# Patient Record
Sex: Male | Born: 1935 | Race: White | Hispanic: No | Marital: Married | Smoking: Never smoker
Health system: Southern US, Community
[De-identification: ages and names within clinical notes are randomized; demographics above are authoritative.]

## PROBLEM LIST (undated history)

## (undated) DIAGNOSIS — I2510 Atherosclerotic heart disease of native coronary artery without angina pectoris: Secondary | ICD-10-CM

## (undated) HISTORY — PX: CORONARY ARTERY BYPASS GRAFT: SHX141

---

## 2003-08-25 ENCOUNTER — Ambulatory Visit (HOSPITAL_COMMUNITY): Admission: RE | Admit: 2003-08-25 | Discharge: 2003-08-25 | Payer: Self-pay | Admitting: Cardiology

## 2017-11-10 ENCOUNTER — Inpatient Hospital Stay (HOSPITAL_COMMUNITY)
Admission: EM | Admit: 2017-11-10 | Discharge: 2017-12-20 | DRG: 393 | Disposition: E | Payer: Medicare HMO | Attending: Internal Medicine | Admitting: Internal Medicine

## 2017-11-10 ENCOUNTER — Emergency Department (HOSPITAL_COMMUNITY): Payer: Medicare HMO

## 2017-11-10 ENCOUNTER — Observation Stay (HOSPITAL_COMMUNITY): Payer: Medicare HMO

## 2017-11-10 ENCOUNTER — Encounter (HOSPITAL_COMMUNITY): Payer: Self-pay | Admitting: Emergency Medicine

## 2017-11-10 DIAGNOSIS — R011 Cardiac murmur, unspecified: Secondary | ICD-10-CM | POA: Diagnosis not present

## 2017-11-10 DIAGNOSIS — J690 Pneumonitis due to inhalation of food and vomit: Secondary | ICD-10-CM

## 2017-11-10 DIAGNOSIS — I10 Essential (primary) hypertension: Secondary | ICD-10-CM | POA: Diagnosis not present

## 2017-11-10 DIAGNOSIS — I493 Ventricular premature depolarization: Secondary | ICD-10-CM | POA: Diagnosis present

## 2017-11-10 DIAGNOSIS — Z66 Do not resuscitate: Secondary | ICD-10-CM | POA: Diagnosis present

## 2017-11-10 DIAGNOSIS — K4030 Unilateral inguinal hernia, with obstruction, without gangrene, not specified as recurrent: Secondary | ICD-10-CM | POA: Diagnosis not present

## 2017-11-10 DIAGNOSIS — R0902 Hypoxemia: Secondary | ICD-10-CM

## 2017-11-10 DIAGNOSIS — Z978 Presence of other specified devices: Secondary | ICD-10-CM

## 2017-11-10 DIAGNOSIS — K4090 Unilateral inguinal hernia, without obstruction or gangrene, not specified as recurrent: Secondary | ICD-10-CM

## 2017-11-10 DIAGNOSIS — R1013 Epigastric pain: Secondary | ICD-10-CM | POA: Diagnosis not present

## 2017-11-10 DIAGNOSIS — J9601 Acute respiratory failure with hypoxia: Secondary | ICD-10-CM | POA: Diagnosis not present

## 2017-11-10 DIAGNOSIS — R079 Chest pain, unspecified: Secondary | ICD-10-CM | POA: Diagnosis present

## 2017-11-10 DIAGNOSIS — Z79899 Other long term (current) drug therapy: Secondary | ICD-10-CM

## 2017-11-10 DIAGNOSIS — R0789 Other chest pain: Secondary | ICD-10-CM | POA: Diagnosis present

## 2017-11-10 DIAGNOSIS — I2510 Atherosclerotic heart disease of native coronary artery without angina pectoris: Secondary | ICD-10-CM | POA: Diagnosis present

## 2017-11-10 DIAGNOSIS — D72829 Elevated white blood cell count, unspecified: Secondary | ICD-10-CM | POA: Diagnosis present

## 2017-11-10 DIAGNOSIS — K56609 Unspecified intestinal obstruction, unspecified as to partial versus complete obstruction: Secondary | ICD-10-CM | POA: Diagnosis present

## 2017-11-10 DIAGNOSIS — R0600 Dyspnea, unspecified: Secondary | ICD-10-CM

## 2017-11-10 DIAGNOSIS — Z8249 Family history of ischemic heart disease and other diseases of the circulatory system: Secondary | ICD-10-CM

## 2017-11-10 DIAGNOSIS — Z515 Encounter for palliative care: Secondary | ICD-10-CM

## 2017-11-10 DIAGNOSIS — R1110 Vomiting, unspecified: Secondary | ICD-10-CM

## 2017-11-10 DIAGNOSIS — R0603 Acute respiratory distress: Secondary | ICD-10-CM | POA: Diagnosis not present

## 2017-11-10 DIAGNOSIS — F0390 Unspecified dementia without behavioral disturbance: Secondary | ICD-10-CM | POA: Diagnosis present

## 2017-11-10 DIAGNOSIS — Z0189 Encounter for other specified special examinations: Secondary | ICD-10-CM

## 2017-11-10 DIAGNOSIS — H9190 Unspecified hearing loss, unspecified ear: Secondary | ICD-10-CM | POA: Diagnosis present

## 2017-11-10 DIAGNOSIS — R000 Tachycardia, unspecified: Secondary | ICD-10-CM | POA: Diagnosis not present

## 2017-11-10 DIAGNOSIS — E860 Dehydration: Secondary | ICD-10-CM | POA: Diagnosis not present

## 2017-11-10 DIAGNOSIS — D72828 Other elevated white blood cell count: Secondary | ICD-10-CM | POA: Diagnosis present

## 2017-11-10 DIAGNOSIS — E876 Hypokalemia: Secondary | ICD-10-CM | POA: Diagnosis not present

## 2017-11-10 DIAGNOSIS — Z951 Presence of aortocoronary bypass graft: Secondary | ICD-10-CM

## 2017-11-10 DIAGNOSIS — N179 Acute kidney failure, unspecified: Secondary | ICD-10-CM | POA: Diagnosis not present

## 2017-11-10 DIAGNOSIS — Z7189 Other specified counseling: Secondary | ICD-10-CM

## 2017-11-10 HISTORY — DX: Atherosclerotic heart disease of native coronary artery without angina pectoris: I25.10

## 2017-11-10 LAB — CBC
HCT: 35.7 % — ABNORMAL LOW (ref 39.0–52.0)
Hemoglobin: 11.5 g/dL — ABNORMAL LOW (ref 13.0–17.0)
MCH: 31.3 pg (ref 26.0–34.0)
MCHC: 32.2 g/dL (ref 30.0–36.0)
MCV: 97 fL (ref 78.0–100.0)
Platelets: 225 K/uL (ref 150–400)
RBC: 3.68 MIL/uL — ABNORMAL LOW (ref 4.22–5.81)
RDW: 13.2 % (ref 11.5–15.5)
WBC: 13.3 K/uL — ABNORMAL HIGH (ref 4.0–10.5)

## 2017-11-10 LAB — BASIC METABOLIC PANEL WITH GFR
Anion gap: 14 (ref 5–15)
BUN: 28 mg/dL — ABNORMAL HIGH (ref 6–20)
CO2: 27 mmol/L (ref 22–32)
Calcium: 9.9 mg/dL (ref 8.9–10.3)
Chloride: 98 mmol/L — ABNORMAL LOW (ref 101–111)
Creatinine, Ser: 1.17 mg/dL (ref 0.61–1.24)
GFR calc Af Amer: >60 mL/min
GFR calc non Af Amer: 53 mL/min — ABNORMAL LOW
Glucose, Bld: 191 mg/dL — ABNORMAL HIGH (ref 65–99)
Potassium: 3.9 mmol/L (ref 3.5–5.1)
Sodium: 139 mmol/L (ref 135–145)

## 2017-11-10 LAB — I-STAT TROPONIN, ED: TROPONIN I, POC: 0.02 ng/mL (ref 0.00–0.08)

## 2017-11-10 MED ORDER — NITROGLYCERIN 2 % TD OINT
1.0000 [in_us] | TOPICAL_OINTMENT | Freq: Once | TRANSDERMAL | Status: AC
  Administered 2017-11-10: 1 [in_us] via TOPICAL
  Filled 2017-11-10: qty 1

## 2017-11-10 MED ORDER — ASPIRIN EC 325 MG PO TBEC
325.0000 mg | DELAYED_RELEASE_TABLET | Freq: Every day | ORAL | Status: DC
  Administered 2017-11-11 – 2017-11-13 (×3): 325 mg via ORAL
  Filled 2017-11-10 (×4): qty 1

## 2017-11-10 MED ORDER — NITROGLYCERIN 0.4 MG SL SUBL: 0.4000 mg | SUBLINGUAL_TABLET | SUBLINGUAL | Status: DC | PRN

## 2017-11-10 MED ORDER — HYDRALAZINE HCL 20 MG/ML IJ SOLN: 5.0000 mg | INTRAMUSCULAR | Status: DC | PRN

## 2017-11-10 MED ORDER — ACETAMINOPHEN 325 MG PO TABS: 650.0000 mg | ORAL_TABLET | Freq: Four times a day (QID) | ORAL | Status: DC | PRN

## 2017-11-10 MED ORDER — LOSARTAN POTASSIUM 50 MG PO TABS
50.0000 mg | ORAL_TABLET | Freq: Every day | ORAL | Status: DC
  Administered 2017-11-11: 50 mg via ORAL
  Filled 2017-11-10: qty 1

## 2017-11-10 MED ORDER — DM-GUAIFENESIN ER 30-600 MG PO TB12: 1.0000 | ORAL_TABLET | Freq: Two times a day (BID) | ORAL | Status: DC | PRN

## 2017-11-10 MED ORDER — CALCIUM CARBONATE ANTACID 500 MG PO CHEW: 1.0000 | CHEWABLE_TABLET | ORAL | Status: DC | PRN

## 2017-11-10 MED ORDER — ADULT MULTIVITAMIN W/MINERALS CH
1.0000 | ORAL_TABLET | Freq: Every day | ORAL | Status: DC
  Administered 2017-11-11 – 2017-11-13 (×3): 1 via ORAL
  Filled 2017-11-10 (×4): qty 1

## 2017-11-10 MED ORDER — ONDANSETRON HCL 4 MG/2ML IJ SOLN
4.0000 mg | Freq: Four times a day (QID) | INTRAMUSCULAR | Status: DC | PRN
  Administered 2017-11-13 – 2017-11-14 (×3): 4 mg via INTRAVENOUS
  Filled 2017-11-10: qty 2

## 2017-11-10 MED ORDER — ENOXAPARIN SODIUM 40 MG/0.4ML ~~LOC~~ SOLN: 40.0000 mg | SUBCUTANEOUS | Status: DC

## 2017-11-10 MED ORDER — PANTOPRAZOLE SODIUM 40 MG PO TBEC
40.0000 mg | DELAYED_RELEASE_TABLET | Freq: Every day | ORAL | Status: DC
  Administered 2017-11-11 – 2017-11-13 (×4): 40 mg via ORAL
  Filled 2017-11-10 (×4): qty 1

## 2017-11-10 MED ORDER — IOPAMIDOL (ISOVUE-300) INJECTION 61%
INTRAVENOUS | Status: AC
  Administered 2017-11-10: 100 mL
  Filled 2017-11-10: qty 100

## 2017-11-10 MED ORDER — MORPHINE SULFATE (PF) 4 MG/ML IV SOLN
1.0000 mg | INTRAVENOUS | Status: DC | PRN
  Administered 2017-11-15: 1 mg via INTRAVENOUS
  Filled 2017-11-10: qty 1

## 2017-11-10 MED ORDER — ZOLPIDEM TARTRATE 5 MG PO TABS: 5.0000 mg | ORAL_TABLET | Freq: Every evening | ORAL | Status: DC | PRN

## 2017-11-10 NOTE — ED Notes (Signed)
Delay in lab draw,  Pt not in room at this time. 

## 2017-11-10 NOTE — H&P (Signed)
History and Physical    Benjamin MassedJames Petersen ZOX:[id number] DOB: Aug 02, 1924 DOA: 10/23/2017  Referring MD/NP/PA:   PCP: Burton Apleyoberts, Ronald, MD   Patient coming from:  The patient is coming from home.  At baseline, pt is partially dependent for most of ADL.   Chief Complaint: chest pain and epigastric abdominal pain  HPI: Benjamin MassedJames Petersen is a 84 y.o. male with medical history significant of hypertension, CAD, CABG in 1992, who presents with chest pain and epigastric abdominal pain.  Pt states that has had several episodes of chest pain since this morning. His pain is located in the frontal lower chest and also in the epigastric area. The pain was intermittent, moderate, sharp, nonradiating. Patient reported to ED physician that he had a mild SOB, but denies any shortness breath to me. No calf pain. Pt was given one dose ASA and NGT patch. He is CP free now.  He has mild cough with clear mucus production. He also has sore throat, but no runny nose. Denies fever or chills. Patient does not have nausea, vomiting, diarrhea, abdominal pain, symptoms of UTI. He has rashes in front chest wall, which has been going on for about a week and are itchy. No new medication recently.   ED Course: pt was found to have WBC 13.1, negative flu PCR, negative rapid strep test, lipase 26, negative troponin, creatinine 1.17, GFR>60, blood pressure 182/91, no tachycardia, temperature normal, oxygen saturation section 97% on room air. Chest x-ray showed streaks of bright basilar opacity. Pending CT-abd/pelivs. Pt is placed on telemetry bed for observation.  Review of Systems:   General: no fevers, chills, no body weight gain, has poor appetite, has fatigue HEENT: no blurry vision, hearing changes or sore throat Respiratory: no dyspnea, has coughing, no wheezing CV: has chest pain, no palpitations GI: no nausea, vomiting, has abdominal pain, no diarrhea, constipation GU: no dysuria, burning on urination, increased urinary  frequency, hematuria  Ext: no leg edema Neuro: no unilateral weakness, numbness, or tingling, no vision change or hearing loss Skin: has rash, no skin tear. MSK: No muscle spasm, no deformity, no limitation of range of movement in spin Heme: No easy bruising.  Travel history: No recent long distant travel.  Allergy: No Known Allergies  Past Medical History:  Diagnosis Date  . Coronary artery disease     Past Surgical History:  Procedure Laterality Date  . CORONARY ARTERY BYPASS GRAFT      Social History:  reports that he has never smoked. He has never used smokeless tobacco. He reports that he drank alcohol. He reports that he has current or past drug history.  Family History:  Family History  Problem Relation Age of Onset  . Heart disease Mother      Prior to Admission medications   Medication Sig Start Date End Date Taking? Authorizing Provider  acetaminophen (TYLENOL) 325 MG tablet Take 325-650 mg by mouth every 6 (six) hours as needed (for pain or headaches).   Yes [provider]  calcium carbonate (TUMS - DOSED IN MG ELEMENTAL CALCIUM) 500 MG chewable tablet Chew 1-2 tablets by mouth as needed for indigestion or heartburn.   Yes [provider]  chlorthalidone (HYGROTON) 25 MG tablet Take 12.5 mg by mouth daily. 09/10/17  Yes [provider]  losartan (COZAAR) 50 MG tablet Take 50 mg by mouth at bedtime.  09/10/17  Yes [provider]  Multiple Vitamins-Minerals (ONE-A-DAY PROACTIVE 65+) TABS Take 1 tablet by mouth daily.   Yes [provider]    Physical Exam: Vitals:   11/11/17 0030 11/11/17 0045 11/11/17 0100 11/11/17 0202  BP: (!) 161/69  (!) 154/89 (!) 158/71  Pulse: 84 87  64  Resp: 18 15 17 18   Temp:    (!) 89.4 F (31.9 C)  TempSrc:    Oral  SpO2: 97% 97% 100% 99%  Weight:    61.4 kg (135 lb 6.4 oz)  Height:    6\' 1"  (1.854 m)   General: Not in acute distress HEENT:       Eyes: PERRL, EOMI, no scleral  icterus.       ENT: No discharge from the ears and nose, no pharynx injection, no tonsillar enlargement.        Neck: No JVD, no bruit, no mass felt. Heme: No neck lymph node enlargement. Cardiac: S1/S2, RRR, No murmurs, No gallops or rubs. Respiratory: no rales, wheezing, rhonchi or rubs. GI: Soft, nondistended, has tenderness in Epigastric area, no rebound pain, no organomegaly, BS present. GU: No hematuria Ext: No pitting leg edema bilaterally. 2+DP/PT pulse bilaterally. Musculoskeletal: No joint deformities, No joint redness or warmth, no limitation of ROM in spin. Skin: has rashes in chest Neuro: Alert, oriented X3, cranial nerves II-XII grossly intact, moves all extremities normally. Psych: Patient is not psychotic, no suicidal or hemocidal ideation.  Labs on Admission: I have personally reviewed following labs and imaging studies  CBC: Recent Labs  Lab 12/03/2017 2150  WBC 13.3*  HGB 11.5*  HCT 35.7*  MCV 97.0  PLT 225   Basic Metabolic Panel: Recent Labs  Lab December 03, 2017 2150  NA 139  K 3.9  CL 98*  CO2 27  GLUCOSE 191*  BUN 28*  CREATININE 1.17  CALCIUM 9.9   GFR: Estimated Creatinine Clearance: 35.7 mL/min (by C-G formula based on SCr of 1.17 mg/dL). Liver Function Tests: Recent Labs  Lab 11/11/17 0010  AST 29  ALT 20  ALKPHOS 68  BILITOT 0.7  PROT 6.6  ALBUMIN 3.8   Recent Labs  Lab 11/11/17 0010  LIPASE 26   No results for input(s): AMMONIA in the last 168 hours. Coagulation Profile: Recent Labs  Lab 11/11/17 0121  INR 1.04   Cardiac Enzymes: Recent Labs  Lab 11/11/17 0010  TROPONINI <0.03   BNP (last 3 results) No results for input(s): PROBNP in the last 8760 hours. HbA1C: No results for input(s): HGBA1C in the last 72 hours. CBG: No results for input(s): GLUCAP in the last 168 hours. Lipid Profile: No results for input(s): CHOL, HDL, LDLCALC, TRIG, CHOLHDL, LDLDIRECT in the last 72 hours. Thyroid Function Tests: No results for  input(s): TSH, T4TOTAL, FREET4, T3FREE, THYROIDAB in the last 72 hours. Anemia Panel: No results for input(s): VITAMINB12, FOLATE, FERRITIN, TIBC, IRON, RETICCTPCT in the last 72 hours. Urine analysis:    Component Value Date/Time   COLORURINE YELLOW 11/11/2017 0050   APPEARANCEUR CLOUDY (A) 11/11/2017 0050   LABSPEC 1.031 (H) 11/11/2017 0050   PHURINE 7.0 11/11/2017 0050   GLUCOSEU NEGATIVE 11/11/2017 0050   HGBUR NEGATIVE 11/11/2017 0050   BILIRUBINUR NEGATIVE 11/11/2017 0050   KETONESUR NEGATIVE 11/11/2017 0050   PROTEINUR 30 (A) 11/11/2017 0050   NITRITE NEGATIVE 11/11/2017 0050   LEUKOCYTESUR NEGATIVE 11/11/2017 0050   Sepsis Labs: @LABRCNTIP (procalcitonin:4,lacticidven:4) ) Recent Results (from the past 240 hour(s))  Rapid Strep Screen (Not at [facility])     Status: None   Collection Time: 11/11/17 12:50 AM  Result Value Ref Range Status   Streptococcus,  Group A Screen (Direct) NEGATIVE NEGATIVE Final    Comment: (NOTE) A Rapid Antigen test may result negative if the antigen level in the sample is below the detection level of this test. The FDA has not cleared this test as a stand-alone test therefore the rapid antigen negative result has reflexed to a Group A Strep culture. Performed at [facility], [address], [place], [place] [address]      Radiological Exams on Admission: Dg Chest 2 View  Result Date: 19-Nov-2017 CLINICAL DATA:  Chest and epigastric pain. EXAM: CHEST - 2 VIEW COMPARISON:  None. FINDINGS: Post median sternotomy. Heart size normal. There is aortic atherosclerosis. Mild vascular congestion. Streaky bibasilar opacities. There is an azygos fissure. No definite pleural fluid or pneumothorax. IMPRESSION: 1. Streaky bibasilar opacities, likely atelectasis. 2. Post median sternotomy with aortic atherosclerosis. Mild vascular congestion. Electronically Signed   By: Rubye Oaks M.D.   On: 2017-11-19 22:57   Ct Abdomen Pelvis W Contrast  Result  Date: 11/11/2017 CLINICAL DATA:  Initial evaluation for acute epigastric abdominal pain for 1 day. EXAM: CT ABDOMEN AND PELVIS WITH CONTRAST TECHNIQUE: Multidetector CT imaging of the abdomen and pelvis was performed using the standard protocol following bolus administration of intravenous contrast. CONTRAST:  ISOVUE-300 IOPAMIDOL (ISOVUE-300) INJECTION 61% COMPARISON:  None available. FINDINGS: Lower chest: Scattered bibasilar atelectatic and/or fibrotic changes present within the lung bases. Superimposed bronchiectasis present at the medial left lung base. No pleural or pericardial effusion. Prominent coronary artery calcifications noted. Hepatobiliary: Multiple scattered cysts noted within the liver, largest discrete of which position within the medial left hepatic lobe in measures approximately 2 cm. Gallbladder irregular invaginating upon the adjacent hepatic parenchyma without associated inflammatory changes. Mild intra and extrahepatic biliary dilatation, suspected to be related to advanced age. Pancreas: Pancreas demonstrates no acute abnormality. Scattered punctate calcifications at the uncinate process of the pancreas may reflect sequelae of chronic pancreatitis. No acute peripancreatic inflammation. No significant pancreatic ductal dilatation. Spleen: Spleen within normal limits. Adrenals/Urinary Tract: Diffuse thickening of the adrenal glands noted bilaterally without discrete mass. Kidneys equal in size with symmetric enhancement. 19 mm left renal cyst noted. No nephrolithiasis, hydronephrosis, or focal enhancing renal mass. No appreciable hydroureter. Partially distended bladder within normal limits. Stomach/Bowel: Intraluminal fluid density noted within the distal esophagus. Small hiatal hernia. Stomach moderately distended with fluid in the gastric lumen. There are multiple dilated loops of bowel scattered throughout the abdomen with internal air-fluid levels, consistent with small bowel  obstruction. These measure up to approximately 3 cm in diameter. There is a left inguinal hernia containing a short segment of fluid-filled small bowel (series 3, image 71). Small bowel is dilated proximally, and decompressed distally as it courses out of the hernia sac, consistent with small bowel obstruction. Distal small bowel is decompressed to the level of the terminal ileum. Moderate stool within the colon which is otherwise unremarkable without acute inflammatory changes. No findings to suggest acute appendicitis. Vascular/Lymphatic: Severe aorto bi-iliac atherosclerotic disease. No aneurysm. Mesenteric vessels are patent proximally. No adenopathy. Reproductive: Prostate somewhat enlarged measuring 5.1 cm in transverse diameter. Other: No free intraperitoneal air. Small volume free fluid within the abdomen and adjacent to the liver, likely reactive. Sequelae of probable prior inguinal hernia repair noted bilaterally, suggesting at the current left inguinal hernia is recurrent. Musculoskeletal: No acute osseus abnormality. Visualized osseous structures demonstrate a mottled appearance without discrete lytic or blastic osseous lesion. 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 IMPRESSION:  1. Left inguinal hernia containing the short-segment of small bowel with secondary small bowel obstruction. 2. Associated small volume free fluid within the abdomen, likely reactive. 3. Advanced 3 vessel coronary artery calcifications with severe atherosclerosis. 4. Additional incidental findings as above. Electronically Signed   By: Rise Mu M.D.   On: 11/11/2017 00:52     EKG: Independently reviewed.  Sinus rhythm, frequent PVC, QTC 456, nonspecific T-wave change.  Assessment/Plan Principal Problem:   SBO (small bowel obstruction) (HCC) Active Problems:   Chest pain   Essential hypertension   Leukocytosis   Epigastric abdominal pain   Left inguinal hernia   SBO 2/2 to  incarcerated left inguinal hernia: pt has epigastric abdominal pain, no nausea, vomiting, but surprisingly CT scan showed shows left inguinal hernia which caused bowel obstruction. Patient has mild leukocytosis, but no fever or tachycardia, dose not meet criteria for sepsis. Currently hemodynamically stable. Gen. Surgeon, Dr. Luisa Hart was consulted. Per Dr. Luisa Hart, pt needs surgery, but currently patient refuses surgical intervention. Dr. Luisa Hart recommended NG tube if he has nausea and vomiting.  Dr. Larwance Rote will reassess pt this morning to see if he changes his mind for surgery.   -Admit to tele bed for obs (bed was requested before knowing SOB) -NPO   -prn NG tube (not started yet) -morphine prn pain -Prn Zofran prn nausea   -IVF: NS 75 cc/h -INR/PTT/type & screen -Follow-up general surgeons recommendation  Chest pain: not sure if this is true chest pain or just radiating pain from abdomen. Trop negative. Giving his history of CAD and s/p of CABG. Will work up to r/o ACS. He may have demand ischemia secondary to elevated blood pressure - cycle CE q6 x3 and repeat EKG in the am  - prn Nitroglycerin, Morphine, and aspirin - Risk factor stratification: will check FLP and A1C  - 2d echo  Essential hypertension: bp 182/91 -continue losartan - hold chlorthalidone while pt is on NPO and need IVF -IV hydralazine when necessary  Leukocytosis: Flu pcr negative. Rapid strep negative. No fever. likely due to SBO. Will hold off Abx now. -f/u Bx and UA -will get Procalcitonin and trend lactic acid levels   DVT ppx: SCD Code Status: DNR (I discussed with patient in the presence of his son and wife who is the POA, and explained the meaning of CODE STATUS. Patient wants to be DNR) Family Communication:  Yes, patient's wife and son at bed side Disposition Plan:  Anticipate discharge back to previous home environment Consults called:  Dr. Luisa Hart of Gen. surgeon Admission status: Obs / tele      Date of Service 11/11/2017    Lorretta Harp [facility] Pager [phone]  If 7PM-7AM, please contact night-coverage [email or web address] Password [facility] 11/11/2017, 4:40 AM

## 2017-11-10 NOTE — ED Triage Notes (Addendum)
Patient arrived with EMS form home , reports intermittent central chest/epigastric pain this evening with mild SOB , no nausea or diaphoresis , he took 1 NTG sl and 1 ASA 325 mg. prior to arrival with relief , denies chest pain at arrival .

## 2017-11-10 NOTE — ED Provider Notes (Signed)
I saw and evaluated the patient, reviewed the resident's note and I agree with the findings and plan.   EKG Interpretation  Date/Time:  Friday November 10 2017 21:42:43 EDT Ventricular Rate:  87 PR Interval:  198 QRS Duration: 92 QT Interval:  388 QTC Calculation: 466 R Axis:   8 Text Interpretation:  Sinus rhythm with frequent Premature ventricular complexes Right atrial enlargement Borderline ECG Confirmed by Lorre NickAllen, Kashira Behunin ([id number]) on 11/16/2017 10:34:4040 PM     84 year old male with prior history of coronary artery disease presents with intermittent chest pain with associated dyspnea.  Was given nitroglycerin and aspirin prior to arrival with relief.  He is currently pain-free.  EKG shows no acute ischemic changes.  Will admit for further management   Lorre NickAllen, Desia Saban, MD 10/27/2017 2307

## 2017-11-10 NOTE — ED Notes (Signed)
Patient transported to CT 

## 2017-11-10 NOTE — ED Provider Notes (Signed)
[facility] Provider Note   CSN: [id number] Arrival date & time: 01/05/2018  2126     History   Chief Complaint Chief Complaint  Patient presents with  . Chest Pain    HPI Charlestine MassedJames Shawgo is a 84 y.o. male.  The history is provided by the patient, the spouse and a relative.  Chest Pain   This is a new problem. The current episode started more than 1 week ago. The problem occurs daily. The problem has been gradually worsening. The pain is present in the substernal region. The patient is experiencing no pain (was moderate). The quality of the pain is described as brief and sharp. The pain does not radiate. Associated symptoms include nausea. Pertinent negatives include no abdominal pain, no back pain, no cough, no exertional chest pressure, no fever, no headaches, no irregular heartbeat, no leg pain, no lower extremity edema, no palpitations, no shortness of breath and no vomiting. He has tried nitroglycerin for the symptoms. The treatment provided significant relief.  His past medical history is significant for CAD and hypertension.    Past Medical History:  Diagnosis Date  . Coronary artery disease     Patient Active Problem List   Diagnosis Date Noted  . Chest pain 04/13/18  . Essential hypertension 04/13/18  . Leukocytosis 04/13/18  . Epigastric abdominal pain 04/13/18       Home Medications    Prior to Admission medications   Medication Sig Start Date End Date Taking? Authorizing Provider  acetaminophen (TYLENOL) 325 MG tablet Take 325-650 mg by mouth every 6 (six) hours as needed (for pain or headaches).   Yes [provider]  calcium carbonate (TUMS - DOSED IN MG ELEMENTAL CALCIUM) 500 MG chewable tablet Chew 1-2 tablets by mouth as needed for indigestion or heartburn.   Yes [provider]  chlorthalidone (HYGROTON) 25 MG tablet Take 12.5 mg by mouth daily. 09/10/17  Yes [provider]    losartan (COZAAR) 50 MG tablet Take 50 mg by mouth at bedtime.  09/10/17  Yes [provider]  Multiple Vitamins-Minerals (ONE-A-DAY PROACTIVE 65+) TABS Take 1 tablet by mouth daily.   Yes [provider]    Family History No family history on file.  Social History Social History   Tobacco Use  . Smoking status: Never Smoker  . Smokeless tobacco: Never Used  Substance Use Topics  . Alcohol use: Not on file  . Drug use: Not on file     Allergies   Patient has no known allergies.   Review of Systems Review of Systems  Constitutional: Positive for fatigue. Negative for chills and fever.  HENT: Negative for ear pain and sore throat.   Eyes: Negative for visual disturbance.  Respiratory: Negative for cough and shortness of breath.   Cardiovascular: Positive for chest pain. Negative for palpitations and leg swelling.  Gastrointestinal: Positive for nausea. Negative for abdominal pain and vomiting.  Genitourinary: Negative for dysuria.  Musculoskeletal: Negative for back pain.  Skin: Negative for rash.  Neurological: Negative for light-headedness and headaches.  All other systems reviewed and are negative.    Physical Exam Updated Vital Signs BP (!) 173/83   Pulse 90   Temp 98.1 F (36.7 C) (Oral)   Resp 15   Ht 6\' 1"  (1.854 m)   Wt 72.6 kg (160 lb)   SpO2 97%   BMI 21.11 kg/m   Physical Exam  Constitutional: He appears well-developed and well-nourished. No distress.  HENT:  Head: Normocephalic and atraumatic.  Eyes: Conjunctivae are normal.  Neck: Neck supple.  Cardiovascular: Normal rate, regular rhythm and intact distal pulses.  No murmur heard. Pulses:      Radial pulses are 2+ on the right side, and 2+ on the left side.  Pulmonary/Chest: Effort normal and breath sounds normal. No respiratory distress. He has no decreased breath sounds. He has no wheezes. He has no rales.  Abdominal: Soft. There is no tenderness.  Musculoskeletal: He  exhibits no edema.       Right lower leg: He exhibits no edema.       Left lower leg: He exhibits no edema.  Neurological: He is alert.  Skin: Skin is warm and dry.  Psychiatric: He has a normal mood and affect.  Nursing note and vitals reviewed.    ED Treatments / Results  Labs (all labs ordered are listed, but only abnormal results are displayed) Labs Reviewed  BASIC METABOLIC PANEL - Abnormal; Notable for the following components:      Result Value   Chloride 98 (*)    Glucose, Bld 191 (*)    BUN 28 (*)    GFR calc non Af Amer 53 (*)    All other components within normal limits  CBC - Abnormal; Notable for the following components:   WBC 13.3 (*)    RBC 3.68 (*)    Hemoglobin 11.5 (*)    HCT 35.7 (*)    All other components within normal limits  RAPID STREP SCREEN (NOT AT [facility])  CULTURE, BLOOD (ROUTINE X 2)  CULTURE, BLOOD (ROUTINE X 2)  HEPATIC FUNCTION PANEL  INFLUENZA PANEL BY PCR (TYPE A & B)  URINALYSIS, ROUTINE W REFLEX MICROSCOPIC  LIPASE, BLOOD  HEMOGLOBIN A1C  LIPID PANEL  TROPONIN I  TROPONIN I  TROPONIN I  CBG MONITORING, ED  I-STAT TROPONIN, ED    EKG EKG Interpretation  Date/Time:  Friday November 10 2017 21:42:43 EDT Ventricular Rate:  87 PR Interval:  198 QRS Duration: 92 QT Interval:  388 QTC Calculation: 466 R Axis:   8 Text Interpretation:  Sinus rhythm with frequent Premature ventricular complexes Right atrial enlargement Borderline ECG Confirmed by Lorre Nick ([id number]) on 10/31/2017 10:34:40 PM   Radiology Dg Chest 2 View  Result Date: 10/27/2017 CLINICAL DATA:  Chest and epigastric pain. EXAM: CHEST - 2 VIEW COMPARISON:  None. FINDINGS: Post median sternotomy. Heart size normal. There is aortic atherosclerosis. Mild vascular congestion. Streaky bibasilar opacities. There is an azygos fissure. No definite pleural fluid or pneumothorax. IMPRESSION: 1. Streaky bibasilar opacities, likely atelectasis. 2. Post median sternotomy with aortic  atherosclerosis. Mild vascular congestion. Electronically Signed   By: Rubye Oaks M.D.   On: 10/30/2017 22:57    Procedures Procedures (including critical care time)  Medications Ordered in ED Medications  acetaminophen (TYLENOL) tablet 650 mg (has no administration in time range)  calcium carbonate (TUMS - dosed in mg elemental calcium) chewable tablet 200-400 mg of elemental calcium (has no administration in time range)  losartan (COZAAR) tablet 50 mg (has no administration in time range)  multivitamin with minerals tablet 1 tablet (has no administration in time range)  pantoprazole (PROTONIX) EC tablet 40 mg (has no administration in time range)  dextromethorphan-guaiFENesin (MUCINEX DM) 30-600 MG per 12 hr tablet 1 tablet (has no administration in time range)  nitroGLYCERIN (NITROSTAT) SL tablet 0.4 mg (has no administration in time range)  morphine 4 MG/ML injection 1 mg (has no administration  in time range)  aspirin EC tablet 325 mg (has no administration in time range)  ondansetron (ZOFRAN) injection 4 mg (has no administration in time range)  enoxaparin (LOVENOX) injection 40 mg (has no administration in time range)  zolpidem (AMBIEN) tablet 5 mg (has no administration in time range)  hydrALAZINE (APRESOLINE) injection 5 mg (has no administration in time range)  nitroGLYCERIN (NITROGLYN) 2 % ointment 1 inch (1 inch Topical Given 2017-11-26 2222)  iopamidol (ISOVUE-300) 61 % injection (100 mLs  Contrast Given 11-26-17 2349)     Initial Impression / Assessment and Plan / ED Course  I have reviewed the triage vital signs and the nursing notes.  Pertinent labs & imaging results that were available during my care of the patient were reviewed by me and considered in my medical decision making (see chart for details).     Patient is a 84 year old male with history of CAD hypertension with a history of CABG 1992.  He presents with 1 week of worsening chest pain.  He states this  started in the epigastrium but over the last few days has been mostly in the substernal chest area.  Episodes are brief and atypical.  No specific aggravating factors.  He received aspirin at home and nitroglycerin with EMS which did resolve his chest pain.  On exam here patient is moderately hypertensive with a blood pressure in the 160s.  He is afebrile and breathing comfortably with no hypoxia.  Lung sounds are clear, heart sounds normal.  No peripheral edema.  Patient is currently chest pain-free.  Equal peripheral pulses.    Nitro patch applied here.  EKG shows sinus rhythm with PVCs but no acute ischemia.  First troponin was 0.02.  Patient's pain may be GI related versus ACS.  Given patient's high risk history, age, patient will be admitted for ACS evaluation and further workup of his chest pain.  Final Clinical Impressions(s) / ED Diagnoses   Final diagnoses:  Chest pain, unspecified type    ED Discharge Orders    None       Dwana Melena, DO 11/11/17 2130

## 2017-11-10 NOTE — ED Notes (Signed)
Patient transported to X-ray 

## 2017-11-11 ENCOUNTER — Observation Stay (HOSPITAL_BASED_OUTPATIENT_CLINIC_OR_DEPARTMENT_OTHER): Payer: Medicare HMO

## 2017-11-11 ENCOUNTER — Encounter (HOSPITAL_COMMUNITY): Payer: Self-pay | Admitting: Internal Medicine

## 2017-11-11 ENCOUNTER — Other Ambulatory Visit: Payer: Self-pay

## 2017-11-11 DIAGNOSIS — I10 Essential (primary) hypertension: Secondary | ICD-10-CM

## 2017-11-11 DIAGNOSIS — K56609 Unspecified intestinal obstruction, unspecified as to partial versus complete obstruction: Secondary | ICD-10-CM | POA: Diagnosis not present

## 2017-11-11 DIAGNOSIS — D72829 Elevated white blood cell count, unspecified: Secondary | ICD-10-CM

## 2017-11-11 DIAGNOSIS — K4090 Unilateral inguinal hernia, without obstruction or gangrene, not specified as recurrent: Secondary | ICD-10-CM | POA: Diagnosis not present

## 2017-11-11 DIAGNOSIS — R079 Chest pain, unspecified: Secondary | ICD-10-CM

## 2017-11-11 DIAGNOSIS — I351 Nonrheumatic aortic (valve) insufficiency: Secondary | ICD-10-CM | POA: Diagnosis not present

## 2017-11-11 DIAGNOSIS — R1013 Epigastric pain: Secondary | ICD-10-CM | POA: Diagnosis not present

## 2017-11-11 DIAGNOSIS — J690 Pneumonitis due to inhalation of food and vomit: Secondary | ICD-10-CM | POA: Diagnosis not present

## 2017-11-11 DIAGNOSIS — N179 Acute kidney failure, unspecified: Secondary | ICD-10-CM | POA: Diagnosis not present

## 2017-11-11 DIAGNOSIS — R1110 Vomiting, unspecified: Secondary | ICD-10-CM | POA: Diagnosis not present

## 2017-11-11 DIAGNOSIS — Z515 Encounter for palliative care: Secondary | ICD-10-CM | POA: Diagnosis not present

## 2017-11-11 DIAGNOSIS — R0603 Acute respiratory distress: Secondary | ICD-10-CM | POA: Diagnosis not present

## 2017-11-11 LAB — COMPREHENSIVE METABOLIC PANEL
ALBUMIN: 3.8 g/dL (ref 3.5–5.0)
ALK PHOS: 74 U/L (ref 38–126)
ALT: 21 U/L (ref 17–63)
AST: 32 U/L (ref 15–41)
Anion gap: 12 (ref 5–15)
BILIRUBIN TOTAL: 0.6 mg/dL (ref 0.3–1.2)
BUN: 24 mg/dL — AB (ref 6–20)
CO2: 23 mmol/L (ref 22–32)
CREATININE: 1.01 mg/dL (ref 0.61–1.24)
Calcium: 9.2 mg/dL (ref 8.9–10.3)
Chloride: 103 mmol/L (ref 101–111)
GFR calc Af Amer: >60 mL/min (ref >60)
GFR calc non Af Amer: >60 mL/min (ref >60)
GLUCOSE: 132 mg/dL — AB (ref 65–99)
Potassium: 4.1 mmol/L (ref 3.5–5.1)
Sodium: 138 mmol/L (ref 135–145)
TOTAL PROTEIN: 6.5 g/dL (ref 6.5–8.1)

## 2017-11-11 LAB — HEPATIC FUNCTION PANEL
ALK PHOS: 68 U/L (ref 38–126)
ALT: 20 U/L (ref 17–63)
AST: 29 U/L (ref 15–41)
Albumin: 3.8 g/dL (ref 3.5–5.0)
BILIRUBIN TOTAL: 0.7 mg/dL (ref 0.3–1.2)
Total Protein: 6.6 g/dL (ref 6.5–8.1)

## 2017-11-11 LAB — URINALYSIS, ROUTINE W REFLEX MICROSCOPIC
BACTERIA UA: NONE SEEN
BILIRUBIN URINE: NEGATIVE
Glucose, UA: NEGATIVE mg/dL
HGB URINE DIPSTICK: NEGATIVE
KETONES UR: NEGATIVE mg/dL
Leukocytes, UA: NEGATIVE
Nitrite: NEGATIVE
Protein, ur: 30 mg/dL — AB
SQUAMOUS EPITHELIAL / LPF: NONE SEEN
Specific Gravity, Urine: 1.031 — ABNORMAL HIGH (ref 1.005–1.030)
pH: 7 (ref 5.0–8.0)

## 2017-11-11 LAB — TYPE AND SCREEN
ABO/RH(D): A POS
ANTIBODY SCREEN: NEGATIVE

## 2017-11-11 LAB — LIPID PANEL
CHOL/HDL RATIO: 2.4 ratio
Cholesterol: 177 mg/dL (ref 0–200)
HDL: 73 mg/dL (ref >40)
LDL CALC: 93 mg/dL (ref 0–99)
Triglycerides: 55 mg/dL (ref <150)
VLDL: 11 mg/dL (ref 0–40)

## 2017-11-11 LAB — LIPASE, BLOOD: LIPASE: 26 U/L (ref 11–51)

## 2017-11-11 LAB — TROPONIN I: Troponin I: 0.04 ng/mL (ref <0.03)

## 2017-11-11 LAB — CBC WITH DIFFERENTIAL/PLATELET
BASOS ABS: 0 10*3/uL (ref 0.0–0.1)
BASOS PCT: 0 %
Eosinophils Absolute: 0 10*3/uL (ref 0.0–0.7)
Eosinophils Relative: 0 %
HCT: 35 % — ABNORMAL LOW (ref 39.0–52.0)
HEMOGLOBIN: 11.3 g/dL — AB (ref 13.0–17.0)
LYMPHS ABS: 0.9 10*3/uL (ref 0.7–4.0)
LYMPHS PCT: 6 %
MCH: 31 pg (ref 26.0–34.0)
MCHC: 32.3 g/dL (ref 30.0–36.0)
MCV: 95.9 fL (ref 78.0–100.0)
Monocytes Absolute: 0.4 10*3/uL (ref 0.1–1.0)
Monocytes Relative: 3 %
NEUTROS ABS: 13 10*3/uL — AB (ref 1.7–7.7)
Neutrophils Relative %: 91 %
Platelets: 208 10*3/uL (ref 150–400)
RBC: 3.65 MIL/uL — ABNORMAL LOW (ref 4.22–5.81)
RDW: 13.1 % (ref 11.5–15.5)
WBC: 14.3 10*3/uL — ABNORMAL HIGH (ref 4.0–10.5)

## 2017-11-11 LAB — PROTIME-INR
INR: 1.04
Prothrombin Time: 13.5 seconds (ref 11.4–15.2)

## 2017-11-11 LAB — ECHOCARDIOGRAM COMPLETE
Height: 73 in
Weight: 2166.4 oz

## 2017-11-11 LAB — RAPID STREP SCREEN (MED CTR MEBANE ONLY): Streptococcus, Group A Screen (Direct): NEGATIVE

## 2017-11-11 LAB — INFLUENZA PANEL BY PCR (TYPE A & B)
Influenza A By PCR: NEGATIVE
Influenza B By PCR: NEGATIVE

## 2017-11-11 LAB — MAGNESIUM: MAGNESIUM: 1.9 mg/dL (ref 1.7–2.4)

## 2017-11-11 LAB — HEMOGLOBIN A1C
HEMOGLOBIN A1C: 6.3 % — AB (ref 4.8–5.6)
MEAN PLASMA GLUCOSE: 134.11 mg/dL

## 2017-11-11 LAB — LACTIC ACID, PLASMA
Lactic Acid, Venous: 2.1 mmol/L (ref 0.5–1.9)
Lactic Acid, Venous: 2.3 mmol/L (ref 0.5–1.9)

## 2017-11-11 LAB — APTT: APTT: 32 s (ref 24–36)

## 2017-11-11 LAB — PROCALCITONIN: Procalcitonin: <0.1 ng/mL

## 2017-11-11 LAB — ABO/RH: ABO/RH(D): A POS

## 2017-11-11 MED ORDER — SODIUM CHLORIDE 0.9 % IV BOLUS (SEPSIS)
1000.0000 mL | Freq: Once | INTRAVENOUS | Status: AC
  Administered 2017-11-11: 1000 mL via INTRAVENOUS

## 2017-11-11 MED ORDER — SODIUM CHLORIDE 0.9 % IV SOLN
INTRAVENOUS | Status: DC
  Administered 2017-11-11 – 2017-11-13 (×5): via INTRAVENOUS

## 2017-11-11 MED ORDER — ENALAPRILAT 1.25 MG/ML IV SOLN
0.6250 mg | Freq: Four times a day (QID) | INTRAVENOUS | Status: DC
  Administered 2017-11-11 – 2017-11-12 (×6): 0.625 mg via INTRAVENOUS
  Filled 2017-11-11 (×8): qty 0.5

## 2017-11-11 NOTE — Progress Notes (Signed)
  Echocardiogram 2D Echocardiogram has been performed.  Celene SkeenVijay  Fawna Cranmer 11/11/2017, 3:06 PM

## 2017-11-11 NOTE — Consult Note (Signed)
Reason for Consult: Small bowel obstruction/left inguinal hernia Referring Physician: Blaine Hamper MD  Benjamin Petersen is an 84 y.o. male.  HPI: Patient is a 84 year old male with a 1 day history of epigastric abdominal pain.  He has a history of significant coronary artery disease status post bypass grafting in the 1990s.  He came in with a 1 day history of epigastric pain.  He denies nausea or vomiting.  Initial workup did not reveal any cardiac issue.  His primary service obtain a CT scan which shows a left inguinal hernia and small bowel obstruction originating from this.  He denies nausea or vomiting.  He complains of left groin pain and his wife states that hernia was diagnosed at this week.  He is extremely hard of hearing but is able to answer questions and is appropriate considering his advanced age.  Location of his pain is epigastrium.  It is dull and achy in nature without radiation.  Nothing makes it better or worse.  Duration is 1 day.  Past Medical History:  Diagnosis Date  . Coronary artery disease     Past Surgical History:  Procedure Laterality Date  . CORONARY ARTERY BYPASS GRAFT      Family History  Problem Relation Age of Onset  . Heart disease Mother     Social History:  reports that he has never smoked. He has never used smokeless tobacco. He reports that he drank alcohol. He reports that he has current or past drug history.  Allergies: No Known Allergies  Medications: I have reviewed the patient's current medications.  Results for orders placed or performed during the hospital encounter of 11/17/2017 (from the past 48 hour(s))  Basic metabolic panel     Status: Abnormal   Collection Time: 10/25/2017  9:50 PM  Result Value Ref Range   Sodium 139 135 - 145 mmol/L   Potassium 3.9 3.5 - 5.1 mmol/L   Chloride 98 (L) 101 - 111 mmol/L   CO2 27 22 - 32 mmol/L   Glucose, Bld 191 (H) 65 - 99 mg/dL   BUN 28 (H) 6 - 20 mg/dL   Creatinine, Ser 1.17 0.61 - 1.24 mg/dL   Calcium 9.9  8.9 - 10.3 mg/dL   GFR calc non Af Amer 53 (L) >60 mL/min   GFR calc Af Amer >60 >60 mL/min    Comment: (NOTE) The eGFR has been calculated using the CKD EPI equation. This calculation has not been validated in all clinical situations. eGFR's persistently <60 mL/min signify possible Chronic Kidney Disease.    Anion gap 14 5 - 15    Comment: Performed at [facility] [address], [place], [place] [address]  CBC     Status: Abnormal   Collection Time: 10/25/2017  9:50 PM  Result Value Ref Range   WBC 13.3 (H) 4.0 - 10.5 K/uL   RBC 3.68 (L) 4.22 - 5.81 MIL/uL   Hemoglobin 11.5 (L) 13.0 - 17.0 g/dL   HCT 35.7 (L) 39.0 - 52.0 %   MCV 97.0 78.0 - 100.0 fL   MCH 31.3 26.0 - 34.0 pg   MCHC 32.2 30.0 - 36.0 g/dL   RDW 13.2 11.5 - 15.5 %   Platelets 225 150 - 400 K/uL    Comment: Performed at [facility] [address] [place],  [address]  I-stat troponin, ED (0, 3)     Status: None   Collection Time: 11/16/2017 10:06 PM  Result Value Ref Range  Troponin i, poc 0.02 0.00 - 0.08 ng/mL   Comment 3            Comment: Due to the release kinetics of cTnI, a negative result within the first hours of the onset of symptoms does not rule out myocardial infarction with certainty. If myocardial infarction is still suspected, repeat the test at appropriate intervals.   Hepatic function panel     Status: Abnormal   Collection Time: 11/11/17 12:10 AM  Result Value Ref Range   Total Protein 6.6 6.5 - 8.1 g/dL   Albumin 3.8 3.5 - 5.0 g/dL   AST 29 15 - 41 U/L   ALT 20 17 - 63 U/L   Alkaline Phosphatase 68 38 - 126 U/L   Total Bilirubin 0.7 0.3 - 1.2 mg/dL   Bilirubin, Direct <0.1 (L) 0.1 - 0.5 mg/dL   Indirect Bilirubin NOT CALCULATED 0.3 - 0.9 mg/dL    Comment: Performed at [facility] [address], [place], [place] [address]  Lipase, blood     Status: None   Collection Time: 11/11/17 12:10 AM  Result Value Ref Range   Lipase 26 11 - 51 U/L    Comment:  Performed at [facility] [address] [place], [place] [address]  Troponin I (q 6hr x 3)     Status: None   Collection Time: 11/11/17 12:10 AM  Result Value Ref Range   Troponin I <0.03 <0.03 ng/mL    Comment: Performed at [facility] [address], [place], [place] [address]  Rapid Strep Screen (Not at [facility])     Status: None   Collection Time: 11/11/17 12:50 AM  Result Value Ref Range   Streptococcus, Group A Screen (Direct) NEGATIVE NEGATIVE    Comment: (NOTE) A Rapid Antigen test may result negative if the antigen level in the sample is below the detection level of this test. The FDA has not cleared this test as a stand-alone test therefore the rapid antigen negative result has reflexed to a Group A Strep culture. Performed at [facility], [place] [address] [place], [place] [address]   Urinalysis, Routine w reflex microscopic     Status: Abnormal   Collection Time: 11/11/17 12:50 AM  Result Value Ref Range   Color, Urine YELLOW YELLOW   APPearance CLOUDY (A) CLEAR   Specific Gravity, Urine 1.031 (H) 1.005 - 1.030   pH 7.0 5.0 - 8.0   Glucose, UA NEGATIVE NEGATIVE mg/dL   Hgb urine dipstick NEGATIVE NEGATIVE   Bilirubin Urine NEGATIVE NEGATIVE   Ketones, ur NEGATIVE NEGATIVE mg/dL   Protein, ur 30 (A) NEGATIVE mg/dL   Nitrite NEGATIVE NEGATIVE   Leukocytes, UA NEGATIVE NEGATIVE   RBC / HPF 6-30 0 - 5 RBC/hpf   WBC, UA 0-5 0 - 5 WBC/hpf   Bacteria, UA NONE SEEN NONE SEEN   Squamous Epithelial / LPF NONE SEEN NONE SEEN   Hyaline Casts, UA PRESENT    Amorphous Crystal PRESENT     Comment: Performed at [facility], [place] [address], [place], [place] [address]    Dg Chest 2 View  Result Date: 11/14/2017 CLINICAL DATA:  Chest and epigastric pain. EXAM: CHEST - 2 VIEW COMPARISON:  None. FINDINGS: Post median sternotomy. Heart size normal. There is aortic atherosclerosis. Mild vascular congestion. Streaky bibasilar opacities. There is an azygos  fissure. No definite pleural fluid or pneumothorax. IMPRESSION: 1. Streaky bibasilar opacities, likely atelectasis. 2. Post  median sternotomy with aortic atherosclerosis. Mild vascular congestion. Electronically Signed   By: Jeb Levering M.D.   On: 10/31/2017 22:57   Ct Abdomen Pelvis W Contrast  Result Date: 11/11/2017 CLINICAL DATA:  Initial evaluation for acute epigastric abdominal pain for 1 day. EXAM: CT ABDOMEN AND PELVIS WITH CONTRAST TECHNIQUE: Multidetector CT imaging of the abdomen and pelvis was performed using the standard protocol following bolus administration of intravenous contrast. CONTRAST:  184m ISOVUE-300 IOPAMIDOL (ISOVUE-300) INJECTION 61% COMPARISON:  None available. FINDINGS: Lower chest: Scattered bibasilar atelectatic and/or fibrotic changes present within the lung bases. Superimposed bronchiectasis present at the medial left lung base. No pleural or pericardial effusion. Prominent coronary artery calcifications noted. Hepatobiliary: Multiple scattered cysts noted within the liver, largest discrete of which position within the medial left hepatic lobe in measures approximately 2 cm. Gallbladder irregular invaginating upon the adjacent hepatic parenchyma without associated inflammatory changes. Mild intra and extrahepatic biliary dilatation, suspected to be related to advanced age. Pancreas: Pancreas demonstrates no acute abnormality. Scattered punctate calcifications at the uncinate process of the pancreas may reflect sequelae of chronic pancreatitis. No acute peripancreatic inflammation. No significant pancreatic ductal dilatation. Spleen: Spleen within normal limits. Adrenals/Urinary Tract: Diffuse thickening of the adrenal glands noted bilaterally without discrete mass. Kidneys equal in size with symmetric enhancement. 19 mm left renal cyst noted. No nephrolithiasis, hydronephrosis, or focal enhancing renal mass. No appreciable hydroureter. Partially distended bladder within  normal limits. Stomach/Bowel: Intraluminal fluid density noted within the distal esophagus. Small hiatal hernia. Stomach moderately distended with fluid in the gastric lumen. There are multiple dilated loops of bowel scattered throughout the abdomen with internal air-fluid levels, consistent with small bowel obstruction. These measure up to approximately 3 cm in diameter. There is a left inguinal hernia containing a short segment of fluid-filled small bowel (series 3, image 71). Small bowel is dilated proximally, and decompressed distally as it courses out of the hernia sac, consistent with small bowel obstruction. Distal small bowel is decompressed to the level of the terminal ileum. Moderate stool within the colon which is otherwise unremarkable without acute inflammatory changes. No findings to suggest acute appendicitis. Vascular/Lymphatic: Severe aorto bi-iliac atherosclerotic disease. No aneurysm. Mesenteric vessels are patent proximally. No adenopathy. Reproductive: Prostate somewhat enlarged measuring 5.1 cm in transverse diameter. Other: No free intraperitoneal air. Small volume free fluid within the abdomen and adjacent to the liver, likely reactive. Sequelae of probable prior inguinal hernia repair noted bilaterally, suggesting at the current left inguinal hernia is recurrent. Musculoskeletal: No acute osseus abnormality. Visualized osseous structures demonstrate a mottled appearance without discrete lytic or blastic osseous lesion. 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 IMPRESSION: 1. Left inguinal hernia containing the short-segment of small bowel with secondary small bowel obstruction. 2. Associated small volume free fluid within the abdomen, likely reactive. 3. Advanced 3 vessel coronary artery calcifications with severe atherosclerosis. 4. Additional incidental findings as above. Electronically Signed   By: BJeannine BogaM.D.   On: 11/11/2017 00:52    Review  of Systems  Constitutional: Negative for chills and fever.  HENT: Positive for hearing loss. Negative for tinnitus.   Eyes: Positive for blurred vision. Negative for double vision.  Respiratory: Negative for cough and shortness of breath.   Cardiovascular: Negative for chest pain and palpitations.  Gastrointestinal: Positive for abdominal pain. Negative for nausea and vomiting.  Genitourinary: Negative for dysuria and urgency.  Musculoskeletal: Positive for joint pain.  Skin: Negative for rash.  Neurological: Negative for dizziness  and headaches.  Endo/Heme/Allergies: Does not bruise/bleed easily.  Psychiatric/Behavioral: Negative for depression.   Blood pressure (!) 161/69, pulse 87, temperature 98.1 F (36.7 C), temperature source Oral, resp. rate 15, height 6' 1"  (1.448 m), weight 72.6 kg (160 lb), SpO2 97 %. Physical Exam  Constitutional: He is oriented to person, place, and time. Vital signs are normal. He appears cachectic. He has a sickly appearance.  HENT:  Head: Normocephalic.  Eyes: Pupils are equal, round, and reactive to light. Conjunctivae are normal.  Neck: Normal range of motion. Neck supple.  Cardiovascular: Normal rate and regular rhythm.  Respiratory: Effort normal and breath sounds normal.  GI: He exhibits distension. There is no rigidity, no rebound and no guarding. A hernia is present. Hernia confirmed positive in the left inguinal area.    Neurological: He is alert and oriented to person, place, and time.  Hard of hearing  Skin: Skin is warm.  Psychiatric: He has a normal mood and affect. His behavior is normal.    Assessment/Plan: Small bowel obstruction secondary to incarcerated left inguinal hernia  Patient refuses any surgical intervention or any intervention to reduce this.  I explained to him and his family that if this is not repaired his bowel obstruction would worsen and this could cause death.  He states "I am 84 years old I do not have much longer  to live anyway".  After lengthy discussion he continues to refuse intervention at this point time.  Recommend NG tube if he has nausea and vomiting.  We will reassess later on this morning to see if he changes his mind.  Would observe for now.  If he continues to refuse surgical intervention, he may need hospice care but hopefully he will change his mind later on this morning.  We will check on him later on this morning to see how he is doing.  He is in no emergent distress but is in a high operative risk given his advanced age and advanced heart disease.  He refuses any other attempt at manual reduction since this was tried at the bedside.  Benjamin Petersen A Maleigha Colvard 11/11/2017, 1:31 AM

## 2017-11-11 NOTE — Progress Notes (Signed)
PROGRESS NOTE    Benjamin Petersen  ZOX:[id number] DOB: 02-22-1924 DOA: 2017/11/29 PCP: Burton Apley, MD   Brief Narrative:  Patient is a 84 year old gentleman history of hypertension, coronary artery disease status post CABG 1992 presented to the ED with chest pain epigastric abdominal pain ongoing since the morning of admission.  Pain located in the frontal lower chest and in the epigastric region intermittent moderate sharp nonradiating.  Per ED physician patient also with complaints of some mild shortness of breath however denies shortness of breath admitting physician.  Patient given aspirin nitroglycerin patch remained chest pain-free.  Patient also with cough with clear mucus production.  Patient with no signs or symptoms of UTI.  The abdomen and pelvis done concerning for left inguinal hernia with small bowel obstruction.  Patient seen in consultation by general surgery however patient is refusing any surgical intervention at this time.  She is also refused manual reduction as he had stated to general surgeon that this was tried before.   Assessment & Plan:   Principal Problem:   SBO (small bowel obstruction) (HCC) Active Problems:   Chest pain   Essential hypertension   Leukocytosis   Epigastric abdominal pain   Left inguinal hernia  #1 left inguinal hernia with small bowel obstruction Patient has been seen in consultation by general surgery and patient refusing any surgical intervention at this time to reduce the hernia.  General surgery explained to patient and family that if not repaired bowel obstruction could worsen leading to death.  Patient assessed by general surgery again today patient still not interested in any surgical intervention at this time.  No hernia seem to have reduced.  Patient to be tried on sips of clears per general surgery and advance diet as bowel function returns.  Continue IV fluids.  Supportive care.  Palliative care consultation for goals of care.  2.   Hypertension Blood pressure stable.  Placed on IV enalapril while patient is currently n.p.o.  Home oral antihypertensive medications on hold.  3.  Leukocytosis Likely a reactive leukocytosis.  Patient afebrile.  Urinalysis unremarkable.  Chest x-ray negative for any acute infiltrates.  No need for antibiotics at this time.  4.  Chest pain Likely referred pain secondary to problem #1.  Patient with no overt chest pain at this time.  Patient with prior history of coronary artery disease status post CABG.  Cardiac enzymes negative x3.  2D echo with EF of 55-60%, no wall motion abnormalities.   DVT prophylaxis: SCDs Code Status: DNR Family Communication: Updated patient, wife and son at bedside. Disposition Plan: Pending hospitalization.  Palliative care consultation pending..   Consultants:   General surgery: Dr. Luisa Hart 11/11/2017  Palliative care pending    Procedures:   Chest x-ray 11/29/2017  CT abdomen and pelvis 11/11/2017  2D echo 11/11/2017  Antimicrobials:   None   Subjective: Hernia reduced per patient.  Patient noted to state he did not want any surgery done.  Patient denies any chest pain.  No abdominal pain.  No nausea or vomiting.  Denies having a bowel movement.  Passing some flatus.  Objective: Vitals:   11/11/17 0540 11/11/17 0632 11/11/17 1125 11/11/17 1600  BP:  (!) 158/76 (!) 150/76 140/80  Pulse:  82 85   Resp:  18 18   Temp: 98.9 F (37.2 C) 98.6 F (37 C) 98.3 F (36.8 C)   TempSrc: Oral Oral Oral   SpO2:  98% 95%   Weight:      Height:  Intake/Output Summary (Last 24 hours) at 11/11/2017 1646 Last data filed at 11/11/2017 1300 Gross per 24 hour  Intake 20 ml  Output 1100 ml  Net -1080 ml   Filed Weights   11/19/2017 2137 11/11/17 0202  Weight: 72.6 kg (160 lb) 61.4 kg (135 lb 6.4 oz)    Examination:  General exam: Appears calm and comfortable  Respiratory system: Clear to auscultation. Respiratory effort  normal. Cardiovascular system: S1 & S2 heard, RRR. No JVD, murmurs, rubs, gallops or clicks. No pedal edema. Gastrointestinal system: Abdomen is nondistended, soft and nontender. No organomegaly or masses felt. Normal bowel sounds heard. Genitourinary: Left inguinal hernia seems to be reduced. Central nervous system: Alert and oriented. No focal neurological deficits. Extremities: Symmetric 5 x 5 power. Skin: No rashes, lesions or ulcers Psychiatry: Judgement and insight appear normal. Mood & affect appropriate.     Data Reviewed: I have personally reviewed following labs and imaging studies  CBC: Recent Labs  Lab 11/09/2017 2150 11/11/17 1101  WBC 13.3* 14.3*  NEUTROABS  --  13.0*  HGB 11.5* 11.3*  HCT 35.7* 35.0*  MCV 97.0 95.9  PLT 225 208   Basic Metabolic Panel: Recent Labs  Lab 11/14/2017 2150 11/11/17 1101  NA 139 138  K 3.9 4.1  CL 98* 103  CO2 27 23  GLUCOSE 191* 132*  BUN 28* 24*  CREATININE 1.17 1.01  CALCIUM 9.9 9.2  MG  --  1.9   GFR: Estimated Creatinine Clearance: 41.4 mL/min (by C-G formula based on SCr of 1.01 mg/dL). Liver Function Tests: Recent Labs  Lab 11/11/17 0010 11/11/17 1101  AST 29 32  ALT 20 21  ALKPHOS 68 74  BILITOT 0.7 0.6  PROT 6.6 6.5  ALBUMIN 3.8 3.8   Recent Labs  Lab 11/11/17 0010  LIPASE 26   No results for input(s): AMMONIA in the last 168 hours. Coagulation Profile: Recent Labs  Lab 11/11/17 0121  INR 1.04   Cardiac Enzymes: Recent Labs  Lab 11/11/17 0010 11/11/17 0442 11/11/17 1101  TROPONINI <0.03 <0.03 0.04*   BNP (last 3 results) No results for input(s): PROBNP in the last 8760 hours. HbA1C: Recent Labs    11/11/17 0442  HGBA1C 6.3*   CBG: No results for input(s): GLUCAP in the last 168 hours. Lipid Profile: Recent Labs    11/11/17 0442  CHOL 177  HDL 73  LDLCALC 93  TRIG 55  CHOLHDL 2.4   Thyroid Function Tests: No results for input(s): TSH, T4TOTAL, FREET4, T3FREE, THYROIDAB in the  last 72 hours. Anemia Panel: No results for input(s): VITAMINB12, FOLATE, FERRITIN, TIBC, IRON, RETICCTPCT in the last 72 hours. Sepsis Labs: Recent Labs  Lab 11/11/17 0442 11/11/17 0616  PROCALCITON <0.10  --   LATICACIDVEN 2.1* 2.3*    Recent Results (from the past 240 hour(s))  Rapid Strep Screen (Not at [facility])     Status: None   Collection Time: 11/11/17 12:50 AM  Result Value Ref Range Status   Streptococcus, Group A Screen (Direct) NEGATIVE NEGATIVE Final    Comment: (NOTE) A Rapid Antigen test may result negative if the antigen level in the sample is below the detection level of this test. The FDA has not cleared this test as a stand-alone test therefore the rapid antigen negative result has reflexed to a Group A Strep culture. Performed at [facility], [address], [place], [place] [address]          Radiology Studies: Dg Chest 2  View  Result Date: 10/20/2017 CLINICAL DATA:  Chest and epigastric pain. EXAM: CHEST - 2 VIEW COMPARISON:  None. FINDINGS: Post median sternotomy. Heart size normal. There is aortic atherosclerosis. Mild vascular congestion. Streaky bibasilar opacities. There is an azygos fissure. No definite pleural fluid or pneumothorax. IMPRESSION: 1. Streaky bibasilar opacities, likely atelectasis. 2. Post median sternotomy with aortic atherosclerosis. Mild vascular congestion. Electronically Signed   By: Rubye OaksMelanie  Ehinger M.D.   On: 11/18/2017 22:57   Ct Abdomen Pelvis W Contrast  Result Date: 11/11/2017 CLINICAL DATA:  Initial evaluation for acute epigastric abdominal pain for 1 day. EXAM: CT ABDOMEN AND PELVIS WITH CONTRAST TECHNIQUE: Multidetector CT imaging of the abdomen and pelvis was performed using the standard protocol following bolus administration of intravenous contrast. CONTRAST:  100mL ISOVUE-300 IOPAMIDOL (ISOVUE-300) INJECTION 61% COMPARISON:  None available. FINDINGS: Lower chest: Scattered bibasilar atelectatic and/or fibrotic  changes present within the lung bases. Superimposed bronchiectasis present at the medial left lung base. No pleural or pericardial effusion. Prominent coronary artery calcifications noted. Hepatobiliary: Multiple scattered cysts noted within the liver, largest discrete of which position within the medial left hepatic lobe in measures approximately 2 cm. Gallbladder irregular invaginating upon the adjacent hepatic parenchyma without associated inflammatory changes. Mild intra and extrahepatic biliary dilatation, suspected to be related to advanced age. Pancreas: Pancreas demonstrates no acute abnormality. Scattered punctate calcifications at the uncinate process of the pancreas may reflect sequelae of chronic pancreatitis. No acute peripancreatic inflammation. No significant pancreatic ductal dilatation. Spleen: Spleen within normal limits. Adrenals/Urinary Tract: Diffuse thickening of the adrenal glands noted bilaterally without discrete mass. Kidneys equal in size with symmetric enhancement. 19 mm left renal cyst noted. No nephrolithiasis, hydronephrosis, or focal enhancing renal mass. No appreciable hydroureter. Partially distended bladder within normal limits. Stomach/Bowel: Intraluminal fluid density noted within the distal esophagus. Small hiatal hernia. Stomach moderately distended with fluid in the gastric lumen. There are multiple dilated loops of bowel scattered throughout the abdomen with internal air-fluid levels, consistent with small bowel obstruction. These measure up to approximately 3 cm in diameter. There is a left inguinal hernia containing a short segment of fluid-filled small bowel (series 3, image 71). Small bowel is dilated proximally, and decompressed distally as it courses out of the hernia sac, consistent with small bowel obstruction. Distal small bowel is decompressed to the level of the terminal ileum. Moderate stool within the colon which is otherwise unremarkable without acute  inflammatory changes. No findings to suggest acute appendicitis. Vascular/Lymphatic: Severe aorto bi-iliac atherosclerotic disease. No aneurysm. Mesenteric vessels are patent proximally. No adenopathy. Reproductive: Prostate somewhat enlarged measuring 5.1 cm in transverse diameter. Other: No free intraperitoneal air. Small volume free fluid within the abdomen and adjacent to the liver, likely reactive. Sequelae of probable prior inguinal hernia repair noted bilaterally, suggesting at the current left inguinal hernia is recurrent. Musculoskeletal: No acute osseus abnormality. Visualized osseous structures demonstrate a mottled appearance without discrete lytic or blastic osseous lesion. 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 IMPRESSION: 1. Left inguinal hernia containing the short-segment of small bowel with secondary small bowel obstruction. 2. Associated small volume free fluid within the abdomen, likely reactive. 3. Advanced 3 vessel coronary artery calcifications with severe atherosclerosis. 4. Additional incidental findings as above. Electronically Signed   By: Rise MuBenjamin  McClintock M.D.   On: 11/11/2017 00:52        Scheduled Meds: . aspirin EC  325 mg Oral Daily  . enalaprilat  0.625 mg Intravenous Q6H  . multivitamin  with minerals  1 tablet Oral Daily  . pantoprazole  40 mg Oral Q1200   Continuous Infusions: . sodium chloride 75 mL/hr at 11/11/17 0732     LOS: 0 days    Time spent: 35 minutes    Ramiro Harvest, MD [facility] Pager [phone]  If 7PM-7AM, please contact night-coverage [email or web address] Password [facility] 11/11/2017, 4:46 PM

## 2017-11-11 NOTE — Progress Notes (Signed)
Echo is done, pt is stable, denies SOB and chest pain, bowel sounds hypoactive, giving sips of clear liquid, Family members updated, palliative care consultation in place, will continue to monitor  [place]dale[place], RN

## 2017-11-11 NOTE — Progress Notes (Signed)
High low bed given to the bed, mat on both side  Lonia Farberekha, RCharity fundraiser

## 2017-11-11 NOTE — Progress Notes (Signed)
Subjective/Chief Complaint: Says hernia went down, does not want surgery   Objective: Vital signs in last 24 hours: Temp:  [98.1 F (36.7 C)-98.6 F (37 C)] 98.6 F (37 C) (03/23 16100632) Pulse Rate:  [43-90] 82 (03/23 0632) Resp:  [12-18] 18 (03/23 0632) BP: (154-182)/(69-96) 158/76 (03/23 0632) SpO2:  [96 %-100 %] 98 % (03/23 96040632) Weight:  [61.4 kg (135 lb 6.4 oz)-72.6 kg (160 lb)] 61.4 kg (135 lb 6.4 oz) (03/23 0202)    Intake/Output from previous day: 03/22 0701 - 03/23 0700 In: 0  Out: 500 [Urine:500] Intake/Output this shift: No intake/output data recorded.  General appearance: cooperative GI: some distention but soft, NT Male genitalia: LIH seems reduced Neuro: HOH  Lab Results:  Recent Labs    11/08/2017 2150  WBC 13.3*  HGB 11.5*  HCT 35.7*  PLT 225   BMET Recent Labs    10/24/2017 2150  NA 139  K 3.9  CL 98*  CO2 27  GLUCOSE 191*  BUN 28*  CREATININE 1.17  CALCIUM 9.9   PT/INR Recent Labs    11/11/17 0121  LABPROT 13.5  INR 1.04   ABG No results for input(s): PHART, HCO3 in the last 72 hours.  Invalid input(s): PCO2, PO2  Studies/Results: Dg Chest 2 View  Result Date: 10/20/2017 CLINICAL DATA:  Chest and epigastric pain. EXAM: CHEST - 2 VIEW COMPARISON:  None. FINDINGS: Post median sternotomy. Heart size normal. There is aortic atherosclerosis. Mild vascular congestion. Streaky bibasilar opacities. There is an azygos fissure. No definite pleural fluid or pneumothorax. IMPRESSION: 1. Streaky bibasilar opacities, likely atelectasis. 2. Post median sternotomy with aortic atherosclerosis. Mild vascular congestion. Electronically Signed   By: Rubye OaksMelanie  Ehinger M.D.   On: 11/16/2017 22:57   Ct Abdomen Pelvis W Contrast  Result Date: 11/11/2017 CLINICAL DATA:  Initial evaluation for acute epigastric abdominal pain for 1 day. EXAM: CT ABDOMEN AND PELVIS WITH CONTRAST TECHNIQUE: Multidetector CT imaging of the abdomen and pelvis was performed  using the standard protocol following bolus administration of intravenous contrast. CONTRAST:  100mL ISOVUE-300 IOPAMIDOL (ISOVUE-300) INJECTION 61% COMPARISON:  None available. FINDINGS: Lower chest: Scattered bibasilar atelectatic and/or fibrotic changes present within the lung bases. Superimposed bronchiectasis present at the medial left lung base. No pleural or pericardial effusion. Prominent coronary artery calcifications noted. Hepatobiliary: Multiple scattered cysts noted within the liver, largest discrete of which position within the medial left hepatic lobe in measures approximately 2 cm. Gallbladder irregular invaginating upon the adjacent hepatic parenchyma without associated inflammatory changes. Mild intra and extrahepatic biliary dilatation, suspected to be related to advanced age. Pancreas: Pancreas demonstrates no acute abnormality. Scattered punctate calcifications at the uncinate process of the pancreas may reflect sequelae of chronic pancreatitis. No acute peripancreatic inflammation. No significant pancreatic ductal dilatation. Spleen: Spleen within normal limits. Adrenals/Urinary Tract: Diffuse thickening of the adrenal glands noted bilaterally without discrete mass. Kidneys equal in size with symmetric enhancement. 19 mm left renal cyst noted. No nephrolithiasis, hydronephrosis, or focal enhancing renal mass. No appreciable hydroureter. Partially distended bladder within normal limits. Stomach/Bowel: Intraluminal fluid density noted within the distal esophagus. Small hiatal hernia. Stomach moderately distended with fluid in the gastric lumen. There are multiple dilated loops of bowel scattered throughout the abdomen with internal air-fluid levels, consistent with small bowel obstruction. These measure up to approximately 3 cm in diameter. There is a left inguinal hernia containing a short segment of fluid-filled small bowel (series 3, image 71). Small bowel is dilated proximally, and  decompressed distally as it courses out of the hernia sac, consistent with small bowel obstruction. Distal small bowel is decompressed to the level of the terminal ileum. Moderate stool within the colon which is otherwise unremarkable without acute inflammatory changes. No findings to suggest acute appendicitis. Vascular/Lymphatic: Severe aorto bi-iliac atherosclerotic disease. No aneurysm. Mesenteric vessels are patent proximally. No adenopathy. Reproductive: Prostate somewhat enlarged measuring 5.1 cm in transverse diameter. Other: No free intraperitoneal air. Small volume free fluid within the abdomen and adjacent to the liver, likely reactive. Sequelae of probable prior inguinal hernia repair noted bilaterally, suggesting at the current left inguinal hernia is recurrent. Musculoskeletal: No acute osseus abnormality. Visualized osseous structures demonstrate a mottled appearance without discrete lytic or blastic osseous lesion. 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 IMPRESSION: 1. Left inguinal hernia containing the short-segment of small bowel with secondary small bowel obstruction. 2. Associated small volume free fluid within the abdomen, likely reactive. 3. Advanced 3 vessel coronary artery calcifications with severe atherosclerosis. 4. Additional incidental findings as above. Electronically Signed   By: Rise Mu M.D.   On: 11/11/2017 00:52    Anti-infectives: Anti-infectives (From admission, onward)   None      Assessment/Plan: LIH - was causing obstruction. It appears to have reduced. He continues to refuse surgery. OK to try sips of clears and would advance as bowel function returns. Please recall [place] if things change.  LOS: 0 days    Liz Malady 11/11/2017

## 2017-11-11 NOTE — ED Notes (Signed)
Delay in lab draw,  Pt not in room 

## 2017-11-11 NOTE — Plan of Care (Signed)
  Problem: Clinical Measurements: Goal: Cardiovascular complication will be avoided Outcome: Completed/Met   Problem: Activity: Goal: Risk for activity intolerance will decrease Outcome: Completed/Met   Problem: Pain Managment: Goal: General experience of comfort will improve Outcome: Completed/Met   Problem: Safety: Goal: Ability to remain free from injury will improve Outcome: Completed/Met

## 2017-11-12 DIAGNOSIS — K4090 Unilateral inguinal hernia, without obstruction or gangrene, not specified as recurrent: Secondary | ICD-10-CM | POA: Diagnosis not present

## 2017-11-12 DIAGNOSIS — Z7189 Other specified counseling: Secondary | ICD-10-CM | POA: Diagnosis not present

## 2017-11-12 DIAGNOSIS — Z515 Encounter for palliative care: Secondary | ICD-10-CM | POA: Diagnosis not present

## 2017-11-12 DIAGNOSIS — I10 Essential (primary) hypertension: Secondary | ICD-10-CM | POA: Diagnosis not present

## 2017-11-12 DIAGNOSIS — K56609 Unspecified intestinal obstruction, unspecified as to partial versus complete obstruction: Secondary | ICD-10-CM

## 2017-11-12 DIAGNOSIS — R079 Chest pain, unspecified: Secondary | ICD-10-CM | POA: Diagnosis not present

## 2017-11-12 LAB — MAGNESIUM: Magnesium: 1.9 mg/dL (ref 1.7–2.4)

## 2017-11-12 LAB — BASIC METABOLIC PANEL
Anion gap: 9 (ref 5–15)
BUN: 24 mg/dL — AB (ref 6–20)
CALCIUM: 8.6 mg/dL — AB (ref 8.9–10.3)
CO2: 27 mmol/L (ref 22–32)
CREATININE: 1.02 mg/dL (ref 0.61–1.24)
Chloride: 102 mmol/L (ref 101–111)
GFR calc Af Amer: >60 mL/min (ref >60)
GFR calc non Af Amer: >60 mL/min (ref >60)
GLUCOSE: 93 mg/dL (ref 65–99)
Potassium: 3.3 mmol/L — ABNORMAL LOW (ref 3.5–5.1)
Sodium: 138 mmol/L (ref 135–145)

## 2017-11-12 LAB — CBC
HEMATOCRIT: 31.2 % — AB (ref 39.0–52.0)
Hemoglobin: 10.3 g/dL — ABNORMAL LOW (ref 13.0–17.0)
MCH: 32 pg (ref 26.0–34.0)
MCHC: 33 g/dL (ref 30.0–36.0)
MCV: 96.9 fL (ref 78.0–100.0)
PLATELETS: 183 10*3/uL (ref 150–400)
RBC: 3.22 MIL/uL — ABNORMAL LOW (ref 4.22–5.81)
RDW: 13.4 % (ref 11.5–15.5)
WBC: 9.5 10*3/uL (ref 4.0–10.5)

## 2017-11-12 MED ORDER — POTASSIUM CHLORIDE 20 MEQ PO PACK
40.0000 meq | PACK | ORAL | Status: AC
  Administered 2017-11-12 (×2): 40 meq via ORAL
  Filled 2017-11-12 (×2): qty 2

## 2017-11-12 MED ORDER — ALUM & MAG HYDROXIDE-SIMETH 200-200-20 MG/5ML PO SUSP
15.0000 mL | Freq: Two times a day (BID) | ORAL | Status: DC | PRN
  Administered 2017-11-12: 15 mL via ORAL
  Filled 2017-11-12: qty 30

## 2017-11-12 NOTE — Progress Notes (Signed)
Subjective/Chief Complaint: abdominal pain Feels better less pain no BM or flatus Hernia back in   Objective: Vital signs in last 24 hours: Temp:  [98.1 F (36.7 C)-99.1 F (37.3 C)] 99.1 F (37.3 C) (03/24 0600) Pulse Rate:  [75-91] 75 (03/24 0600) Resp:  [18-20] 20 (03/24 0600) BP: (118-150)/(52-95) 119/52 (03/24 0600) SpO2:  [92 %-96 %] 92 % (03/24 0600) Weight:  [62 kg (136 lb 11.2 oz)] 62 kg (136 lb 11.2 oz) (03/24 0600)    Intake/Output from previous day: 03/23 0701 - 03/24 0700 In: 2077.5 [P.O.:70; I.V.:2007.5] Out: 1300 [Urine:1300] Intake/Output this shift: No intake/output data recorded.  General appearance: alert and cooperative Resp: clear to auscultation bilaterally GI: reduced LIH SOFT LESS DISTENDED  Lab Results:  Recent Labs    17-Mar-2018 2150 11/11/17 1101  WBC 13.3* 14.3*  HGB 11.5* 11.3*  HCT 35.7* 35.0*  PLT 225 208   BMET Recent Labs    17-Mar-2018 2150 11/11/17 1101  NA 139 138  K 3.9 4.1  CL 98* 103  CO2 27 23  GLUCOSE 191* 132*  BUN 28* 24*  CREATININE 1.17 1.01  CALCIUM 9.9 9.2   PT/INR Recent Labs    11/11/17 0121  LABPROT 13.5  INR 1.04   ABG No results for input(s): PHART, HCO3 in the last 72 hours.  Invalid input(s): PCO2, PO2  Studies/Results: Dg Chest 2 View  Result Date: 11-28-2017 CLINICAL DATA:  Chest and epigastric pain. EXAM: CHEST - 2 VIEW COMPARISON:  None. FINDINGS: Post median sternotomy. Heart size normal. There is aortic atherosclerosis. Mild vascular congestion. Streaky bibasilar opacities. There is an azygos fissure. No definite pleural fluid or pneumothorax. IMPRESSION: 1. Streaky bibasilar opacities, likely atelectasis. 2. Post median sternotomy with aortic atherosclerosis. Mild vascular congestion. Electronically Signed   By: Rubye OaksMelanie  Ehinger M.D.   On: 004-04-2018 22:57   Ct Abdomen Pelvis W Contrast  Result Date: 11/11/2017 CLINICAL DATA:  Initial evaluation for acute epigastric abdominal pain for  1 day. EXAM: CT ABDOMEN AND PELVIS WITH CONTRAST TECHNIQUE: Multidetector CT imaging of the abdomen and pelvis was performed using the standard protocol following bolus administration of intravenous contrast. CONTRAST:  100mL ISOVUE-300 IOPAMIDOL (ISOVUE-300) INJECTION 61% COMPARISON:  None available. FINDINGS: Lower chest: Scattered bibasilar atelectatic and/or fibrotic changes present within the lung bases. Superimposed bronchiectasis present at the medial left lung base. No pleural or pericardial effusion. Prominent coronary artery calcifications noted. Hepatobiliary: Multiple scattered cysts noted within the liver, largest discrete of which position within the medial left hepatic lobe in measures approximately 2 cm. Gallbladder irregular invaginating upon the adjacent hepatic parenchyma without associated inflammatory changes. Mild intra and extrahepatic biliary dilatation, suspected to be related to advanced age. Pancreas: Pancreas demonstrates no acute abnormality. Scattered punctate calcifications at the uncinate process of the pancreas may reflect sequelae of chronic pancreatitis. No acute peripancreatic inflammation. No significant pancreatic ductal dilatation. Spleen: Spleen within normal limits. Adrenals/Urinary Tract: Diffuse thickening of the adrenal glands noted bilaterally without discrete mass. Kidneys equal in size with symmetric enhancement. 19 mm left renal cyst noted. No nephrolithiasis, hydronephrosis, or focal enhancing renal mass. No appreciable hydroureter. Partially distended bladder within normal limits. Stomach/Bowel: Intraluminal fluid density noted within the distal esophagus. Small hiatal hernia. Stomach moderately distended with fluid in the gastric lumen. There are multiple dilated loops of bowel scattered throughout the abdomen with internal air-fluid levels, consistent with small bowel obstruction. These measure up to approximately 3 cm in diameter. There is a left inguinal hernia  containing a short segment of fluid-filled small bowel (series 3, image 71). Small bowel is dilated proximally, and decompressed distally as it courses out of the hernia sac, consistent with small bowel obstruction. Distal small bowel is decompressed to the level of the terminal ileum. Moderate stool within the colon which is otherwise unremarkable without acute inflammatory changes. No findings to suggest acute appendicitis. Vascular/Lymphatic: Severe aorto bi-iliac atherosclerotic disease. No aneurysm. Mesenteric vessels are patent proximally. No adenopathy. Reproductive: Prostate somewhat enlarged measuring 5.1 cm in transverse diameter. Other: No free intraperitoneal air. Small volume free fluid within the abdomen and adjacent to the liver, likely reactive. Sequelae of probable prior inguinal hernia repair noted bilaterally, suggesting at the current left inguinal hernia is recurrent. Musculoskeletal: No acute osseus abnormality. Visualized osseous structures demonstrate a mottled appearance without discrete lytic or blastic osseous lesion. 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 IMPRESSION: 1. Left inguinal hernia containing the short-segment of small bowel with secondary small bowel obstruction. 2. Associated small volume free fluid within the abdomen, likely reactive. 3. Advanced 3 vessel coronary artery calcifications with severe atherosclerosis. 4. Additional incidental findings as above. Electronically Signed   By: Rise Mu M.D.   On: 11/11/2017 00:52    Anti-infectives: Anti-infectives (From admission, onward)   None      Assessment/Plan: Patient Active Problem List   Diagnosis Date Noted  . Left inguinal hernia 11/11/2017  . SBO (small bowel obstruction) (HCC) 11/11/2017  . Chest pain 11-15-2017  . Essential hypertension 15-Nov-2017  . Leukocytosis 11-15-2017  . Epigastric abdominal pain 2017-11-15    START CLEARS Will sign off at this point   Can advance diet as tolerated    LOS: 0 days    Clovis Pu Julianny Milstein 11/12/2017

## 2017-11-12 NOTE — Progress Notes (Signed)
Pt is stable throughout the day, vitals stable, denies chest pain and SOB, diet is advanced to clear and after soft and tolerating well, no BM and hypoactive bowel sound, will continue to monitor the patient  Lonia FarberRekha, RN

## 2017-11-12 NOTE — Progress Notes (Signed)
PROGRESS NOTE    Benjamin Petersen  ZOX:[id number] DOB: 05-06-1924 DOA: 11/07/2017 PCP: Burton Apley, MD   Brief Narrative:  Patient is a 84 year old gentleman history of hypertension, coronary artery disease status post CABG 1992 presented to the ED with chest pain epigastric abdominal pain ongoing since the morning of admission.  Pain located in the frontal lower chest and in the epigastric region intermittent moderate sharp nonradiating.  Per ED physician patient also with complaints of some mild shortness of breath however denies shortness of breath admitting physician.  Patient given aspirin nitroglycerin patch remained chest pain-free.  Patient also with cough with clear mucus production.  Patient with no signs or symptoms of UTI.  The abdomen and pelvis done concerning for left inguinal hernia with small bowel obstruction.  Patient seen in consultation by general surgery however patient is refusing any surgical intervention at this time.  She is also refused manual reduction as he had stated to general surgeon that this was tried before.   Assessment & Plan:   Principal Problem:   SBO (small bowel obstruction) (HCC) Active Problems:   Chest pain   Essential hypertension   Leukocytosis   Epigastric abdominal pain   Left inguinal hernia  #1 left inguinal hernia with small bowel obstruction Patient has been seen in consultation by general surgery and patient refusing any surgical intervention at this time to reduce the hernia.  General surgery explained to patient and family that if not repaired bowel obstruction could worsen leading to death.  Patient assessed by general surgery again today patient still not interested in any surgical intervention at this time.  Inguinal hernia reduced.  Per nurse tech patient noted to have bowel movement this morning.  Patient's diet has been advanced to clear liquids per general surgery which he is tolerating and asking for further advancement.  Placed  on full liquid diet for supper and soft diet for breakfast.  Continue gentle hydration.  Continue supportive care.  Palliative care following.   2.  Hypertension Blood pressure stable.  Discontinue IV enalapril.  Likely resume Cozaar tomorrow.  Saline lock IV fluids.  Follow.   3.  Leukocytosis Likely a reactive leukocytosis.  WBC trending down.  Patient afebrile.  Urinalysis unremarkable.  Chest x-ray negative for any acute infiltrates.  No need for antibiotics.  Follow.    4.  Chest pain Likely referred pain secondary to problem #1.  Patient with no overt chest pain at this time.  Patient with prior history of coronary artery disease status post CABG.  Cardiac enzymes negative x3.  2D echo with EF of 55-60%, no wall motion abnormalities.  No further workup needed at this time.  Discontinue IV enalapril.  Resume home dose Cozaar.  5.  Hypokalemia Replete.   DVT prophylaxis: SCDs Code Status: DNR Family Communication: Updated patient, wife and son at bedside. Disposition Plan: Likely home with home health if continued improvement, tolerating solid diet and likely with home health hopefully in the next 24-48 hours.    Consultants:   General surgery: Dr. Luisa Hart 11/11/2017  Palliative care Eduard Roux, NP/Dr. Linna Darner 11/12/2017    Procedures:   Chest x-ray 11/17/2017  CT abdomen and pelvis 11/11/2017  2D echo 11/11/2017  Antimicrobials:   None   Subjective: Hernia reduced.  Patient with no chest pain or shortness of breath.  No nausea or vomiting.  No abdominal pain.  Per nurse tech patient with bowel movement at 930 this morning.  Patient tolerating clear liquids and asking  for diet to be advanced.    Objective: Vitals:   11/12/17 0014 11/12/17 0021 11/12/17 0600 11/12/17 1224  BP: 118/71  (!) 119/52 (!) 112/51  Pulse: 83 91 75 (!) 55  Resp: 20  20 18   Temp: 99.1 F (37.3 C)  99.1 F (37.3 C) 98.6 F (37 C)  TempSrc: Oral  Oral Oral  SpO2: 94%  92% 95%  Weight:   62  kg (136 lb 11.2 oz)   Height:        Intake/Output Summary (Last 24 hours) at 11/12/2017 1331 Last data filed at 11/12/2017 1227 Gross per 24 hour  Intake 1325 ml  Output 900 ml  Net 425 ml   Filed Weights   11/06/2017 2137 11/11/17 0202 11/12/17 0600  Weight: 72.6 kg (160 lb) 61.4 kg (135 lb 6.4 oz) 62 kg (136 lb 11.2 oz)    Examination:  General exam: Appears calm and comfortable  Respiratory system: Lungs clear to auscultation bilaterally.  No wheezes, no crackles, no rhonchi.  Respiratory effort normal. Cardiovascular system: Regular rate and rhythm no murmurs rubs or gallops.  No JVD.  No pedal edema.  Gastrointestinal system: Abdomen is soft, nontender, nondistended, positive bowel sounds.  No rebound.  No guarding.  Genitourinary: Left inguinal hernia reduced. Central nervous system: Alert and oriented. No focal neurological deficits. Extremities: Symmetric 5 x 5 power. Skin: No rashes, lesions or ulcers Psychiatry: Judgement and insight appear normal. Mood & affect appropriate.     Data Reviewed: I have personally reviewed following labs and imaging studies  CBC: Recent Labs  Lab 10/20/2017 2150 11/11/17 1101 11/12/17 0738  WBC 13.3* 14.3* 9.5  NEUTROABS  --  13.0*  --   HGB 11.5* 11.3* 10.3*  HCT 35.7* 35.0* 31.2*  MCV 97.0 95.9 96.9  PLT 225 208 183   Basic Metabolic Panel: Recent Labs  Lab 10/29/2017 2150 11/11/17 1101 11/12/17 0738  NA 139 138 138  K 3.9 4.1 3.3*  CL 98* 103 102  CO2 27 23 27   GLUCOSE 191* 132* 93  BUN 28* 24* 24*  CREATININE 1.17 1.01 1.02  CALCIUM 9.9 9.2 8.6*  MG  --  1.9 1.9   GFR: Estimated Creatinine Clearance: 41.4 mL/min (by C-G formula based on SCr of 1.02 mg/dL). Liver Function Tests: Recent Labs  Lab 11/11/17 0010 11/11/17 1101  AST 29 32  ALT 20 21  ALKPHOS 68 74  BILITOT 0.7 0.6  PROT 6.6 6.5  ALBUMIN 3.8 3.8   Recent Labs  Lab 11/11/17 0010  LIPASE 26   No results for input(s): AMMONIA in the last 168  hours. Coagulation Profile: Recent Labs  Lab 11/11/17 0121  INR 1.04   Cardiac Enzymes: Recent Labs  Lab 11/11/17 0010 11/11/17 0442 11/11/17 1101  TROPONINI <0.03 <0.03 0.04*   BNP (last 3 results) No results for input(s): PROBNP in the last 8760 hours. HbA1C: Recent Labs    11/11/17 0442  HGBA1C 6.3*   CBG: No results for input(s): GLUCAP in the last 168 hours. Lipid Profile: Recent Labs    11/11/17 0442  CHOL 177  HDL 73  LDLCALC 93  TRIG 55  CHOLHDL 2.4   Thyroid Function Tests: No results for input(s): TSH, T4TOTAL, FREET4, T3FREE, THYROIDAB in the last 72 hours. Anemia Panel: No results for input(s): VITAMINB12, FOLATE, FERRITIN, TIBC, IRON, RETICCTPCT in the last 72 hours. Sepsis Labs: Recent Labs  Lab 11/11/17 0442 11/11/17 0616  PROCALCITON <0.10  --   LATICACIDVEN 2.1*  2.3*    Recent Results (from the past 240 hour(s))  Rapid Strep Screen (Not at [facility])     Status: None   Collection Time: 11/11/17 12:50 AM  Result Value Ref Range Status   Streptococcus, Group A Screen (Direct) NEGATIVE NEGATIVE Final    Comment: (NOTE) A Rapid Antigen test may result negative if the antigen level in the sample is below the detection level of this test. The FDA has not cleared this test as a stand-alone test therefore the rapid antigen negative result has reflexed to a Group A Strep culture. Performed at [facility], [address], [place], [place] [address]   Culture, group A strep     Status: None (Preliminary result)   Collection Time: 11/11/17 12:50 AM  Result Value Ref Range Status   Specimen Description THROAT  Final   Special Requests NONE Reflexed from [id number]  Final   Culture   Final    CULTURE REINCUBATED FOR BETTER GROWTH Performed at [facility], [address], [place], [place] [address]    Report Status PENDING  Incomplete         Radiology Studies: Dg Chest 2 View  Result Date: 11/06/2017 CLINICAL DATA:  Chest and  epigastric pain. EXAM: CHEST - 2 VIEW COMPARISON:  None. FINDINGS: Post median sternotomy. Heart size normal. There is aortic atherosclerosis. Mild vascular congestion. Streaky bibasilar opacities. There is an azygos fissure. No definite pleural fluid or pneumothorax. IMPRESSION: 1. Streaky bibasilar opacities, likely atelectasis. 2. Post median sternotomy with aortic atherosclerosis. Mild vascular congestion. Electronically Signed   By: Rubye Oaks M.D.   On: 10/31/2017 22:57   Ct Abdomen Pelvis W Contrast  Result Date: 11/11/2017 CLINICAL DATA:  Initial evaluation for acute epigastric abdominal pain for 1 day. EXAM: CT ABDOMEN AND PELVIS WITH CONTRAST TECHNIQUE: Multidetector CT imaging of the abdomen and pelvis was performed using the standard protocol following bolus administration of intravenous contrast. CONTRAST:  ISOVUE-300 IOPAMIDOL (ISOVUE-300) INJECTION 61% COMPARISON:  None available. FINDINGS: Lower chest: Scattered bibasilar atelectatic and/or fibrotic changes present within the lung bases. Superimposed bronchiectasis present at the medial left lung base. No pleural or pericardial effusion. Prominent coronary artery calcifications noted. Hepatobiliary: Multiple scattered cysts noted within the liver, largest discrete of which position within the medial left hepatic lobe in measures approximately 2 cm. Gallbladder irregular invaginating upon the adjacent hepatic parenchyma without associated inflammatory changes. Mild intra and extrahepatic biliary dilatation, suspected to be related to advanced age. Pancreas: Pancreas demonstrates no acute abnormality. Scattered punctate calcifications at the uncinate process of the pancreas may reflect sequelae of chronic pancreatitis. No acute peripancreatic inflammation. No significant pancreatic ductal dilatation. Spleen: Spleen within normal limits. Adrenals/Urinary Tract: Diffuse thickening of the adrenal glands noted bilaterally without discrete  mass. Kidneys equal in size with symmetric enhancement. 19 mm left renal cyst noted. No nephrolithiasis, hydronephrosis, or focal enhancing renal mass. No appreciable hydroureter. Partially distended bladder within normal limits. Stomach/Bowel: Intraluminal fluid density noted within the distal esophagus. Small hiatal hernia. Stomach moderately distended with fluid in the gastric lumen. There are multiple dilated loops of bowel scattered throughout the abdomen with internal air-fluid levels, consistent with small bowel obstruction. These measure up to approximately 3 cm in diameter. There is a left inguinal hernia containing a short segment of fluid-filled small bowel (series 3, image 71). Small bowel is dilated proximally, and decompressed distally as it courses out of the hernia sac, consistent with small bowel obstruction. Distal  small bowel is decompressed to the level of the terminal ileum. Moderate stool within the colon which is otherwise unremarkable without acute inflammatory changes. No findings to suggest acute appendicitis. Vascular/Lymphatic: Severe aorto bi-iliac atherosclerotic disease. No aneurysm. Mesenteric vessels are patent proximally. No adenopathy. Reproductive: Prostate somewhat enlarged measuring 5.1 cm in transverse diameter. Other: No free intraperitoneal air. Small volume free fluid within the abdomen and adjacent to the liver, likely reactive. Sequelae of probable prior inguinal hernia repair noted bilaterally, suggesting at the current left inguinal hernia is recurrent. Musculoskeletal: No acute osseus abnormality. Visualized osseous structures demonstrate a mottled appearance without discrete lytic or blastic osseous lesion. 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 IMPRESSION: 1. Left inguinal hernia containing the short-segment of small bowel with secondary small bowel obstruction. 2. Associated small volume free fluid within the abdomen, likely reactive.  3. Advanced 3 vessel coronary artery calcifications with severe atherosclerosis. 4. Additional incidental findings as above. Electronically Signed   By: Rise Mu M.D.   On: 11/11/2017 00:52        Scheduled Meds: . aspirin EC  325 mg Oral Daily  . enalaprilat  0.625 mg Intravenous Q6H  . multivitamin with minerals  1 tablet Oral Daily  . pantoprazole  40 mg Oral Q1200  . potassium chloride  40 mEq Oral Q4H   Continuous Infusions: . sodium chloride 75 mL/hr at 11/12/17 1322     LOS: 0 days    Time spent: 35 minutes    Ramiro Harvest, MD [facility] Pager [phone]  If 7PM-7AM, please contact night-coverage [email or web address] Password [email or web address] 11/12/2017, 1:31 PM

## 2017-11-12 NOTE — Consult Note (Signed)
Consultation Note Date: 11/12/2017   Patient Name: Benjamin Petersen  DOB: February 16, 1924  MRN: [id number]  Age / Sex: 84 y.o., male  PCP: Benjamin Dy, MD Referring Physician: Eugenie Filler, MD  Reason for Consultation: Establishing goals of care and Psychosocial/spiritual support  HPI/Patient Profile: 84 y.o. male  with past medical history of coronary artery disease, CABG 1992, hypertension admitted on 10/21/2017 with complaints of chest pain as well as abdominal pain.  Patient reports that this started approximately 1 day prior to presentation to emergency room.  Per CT, patient has an incarcerated hernia with subsequent small bowel obstruction.  He has been seen by surgery and despite being told that without surgical repair, this condition could lead to death, he has been refusing surgery.  Consult ordered for goals of care.   Clinical Assessment and Goals of Care: Met with patient, chart reviewed.  Patient's wife Benjamin Petersen, and son Benjamin Petersen, are at the bedside.  Per chart review today specifically surgical note, hernia is now back in, belly is soft, less distended.  Patient reports (wife verifies this) that he has had a bowel movement this morning.  His  diet has been advanced to clear liquids and he ate 100% of his lunch.  He is denying any nausea, vomiting or worsening abdominal pain since eating  Patient at this point is able to participate in assessment.  He does have short-term memory deficits as well as being severely hard of hearing.  His wife and son would be his healthcare proxy in the event he were unable to speak for himself.  They were all present in the emergency room when patient received his initial diagnosis.  I did ask them how they felt about Benjamin Petersen refusing surgery and both wife and son, report that they support patient's decision and feel that this is in keeping, consistent, with discussions that  he has had with them in the past    SUMMARY OF RECOMMENDATIONS   Confirmed DNR/DNI Patient is showing some improvement from initial clinical presentation.  He is still refusing surgery, stating "I have lived 68 years and I am ready to go be with the Benjamin Petersen" We did discuss that this could happen again.  I stressed to family to seek medical attention as soon as this is seen in hopes to avoid complications such as a bowel obstruction, complete incarceration Code Status/Advance Care Planning:  DNR    Symptom Management:   Pain: Patient is denying any acute pain today.  He has tolerated a clear liquid diet without distress.  Continue with morphine 1 mg IV every 4 hours as needed while in the hospital.  Palliative Prophylaxis:   Aspiration, Bowel Regimen, Delirium Protocol, Eye Care, Frequent Pain Assessment, Oral Care and Turn Reposition  Additional Recommendations (Limitations, Scope, Preferences):  Avoid Hospitalization and No Surgical Procedures  Psycho-social/Spiritual:   Desire for further Chaplaincy support:no   Prognosis:   Unable to determine  Discharge Planning: Home with Home Health      Primary Diagnoses: Present on Admission: .  Chest pain . Essential hypertension . Leukocytosis . Epigastric abdominal pain . SBO (small bowel obstruction) (HCC)   I have reviewed the medical record, interviewed the patient and family, and examined the patient. The following aspects are pertinent.  Past Medical History:  Diagnosis Date  . Coronary artery disease    Social History   Socioeconomic History  . Marital status: Married    Spouse name: Not on file  . Number of children: Not on file  . Years of education: Not on file  . Highest education level: Not on file  Occupational History  . Not on file  Social Needs  . Financial resource strain: Not on file  . Food insecurity:    Worry: Not on file    Inability: Not on file  . Transportation needs:    Medical: Not  on file    Non-medical: Not on file  Tobacco Use  . Smoking status: Never Smoker  . Smokeless tobacco: Never Used  Substance and Sexual Activity  . Alcohol use: Not Currently  . Drug use: Not Currently  . Sexual activity: Not on file  Lifestyle  . Physical activity:    Days per week: Not on file    Minutes per session: Not on file  . Stress: Not on file  Relationships  . Social connections:    Talks on phone: Not on file    Gets together: Not on file    Attends religious service: Not on file    Active member of club or organization: Not on file    Attends meetings of clubs or organizations: Not on file    Relationship status: Not on file  Other Topics Concern  . Not on file  Social History Narrative  . Not on file   Family History  Problem Relation Age of Onset  . Heart disease Mother    Scheduled Meds: . aspirin EC  325 mg Oral Daily  . enalaprilat  0.625 mg Intravenous Q6H  . multivitamin with minerals  1 tablet Oral Daily  . pantoprazole  40 mg Oral Q1200  . potassium chloride  40 mEq Oral Q4H   Continuous Infusions: . sodium chloride 75 mL/hr at 11/12/17 1322   PRN Meds:.acetaminophen, calcium carbonate, dextromethorphan-guaiFENesin, hydrALAZINE, morphine injection, nitroGLYCERIN, ondansetron (ZOFRAN) IV, zolpidem Medications Prior to Admission:  Prior to Admission medications   Medication Sig Start Date End Date Taking? Authorizing Provider  acetaminophen (TYLENOL) 325 MG tablet Take 325-650 mg by mouth every 6 (six) hours as needed (for pain or headaches).   Yes [provider]  calcium carbonate (TUMS - DOSED IN MG ELEMENTAL CALCIUM) 500 MG chewable tablet Chew 1-2 tablets by mouth as needed for indigestion or heartburn.   Yes [provider]  chlorthalidone (HYGROTON) 25 MG tablet Take 12.5 mg by mouth daily. 09/10/17  Yes [provider]  losartan (COZAAR) 50 MG tablet Take 50 mg by mouth at bedtime.  09/10/17  Yes [provider]  Multiple Vitamins-Minerals (ONE-A-DAY PROACTIVE 65+) TABS Take 1 tablet by mouth daily.   Yes [provider]   No Known Allergies Review of Systems  Unable to perform ROS: Dementia    Physical Exam  Constitutional:  Frail elderly man in no acute distress.  He is extremely hard of hearing  HENT:  Head: Normocephalic and atraumatic.  Cardiovascular:  Bradycardic, heart rate 55  Pulmonary/Chest: Effort normal.  Abdominal: Soft. He exhibits distension.  Patient reports having a bowel movement today  Musculoskeletal:   Normal range of motion.  Neurological: He is alert.  He is extremely hard of hearing but oriented to person place and situation  Skin: Skin is warm and dry.  Psychiatric: He has a normal mood and affect. His behavior is normal.  Nursing note and vitals reviewed.   Vital Signs: BP (!) 112/51 (BP Location: Right Arm)   Pulse (!) 55   Temp 98.6 F (37 C) (Oral)   Resp 18   Ht 6' 1" (1.854 m)   Wt 62 kg (136 lb 11.2 oz)   SpO2 95%   BMI 18.04 kg/m  Pain Scale: 0-10   Pain Score: 0-No pain   SpO2: SpO2: 95 % O2 Device:SpO2: 95 % O2 Flow Rate: .   IO: Intake/output summary:   Intake/Output Summary (Last 24 hours) at 11/12/2017 1447 Last data filed at 11/12/2017 1227 Gross per 24 hour  Intake 1325 ml  Output 900 ml  Net 425 ml    LBM:   Baseline Weight: Weight: 72.6 kg (160 lb) Most recent weight: Weight: 62 kg (136 lb 11.2 oz)     Palliative Assessment/Data:   Flowsheet Rows     Most Recent Value  Intake Tab  Referral Department  Hospitalist  Unit at Time of Referral  Med/Surg Unit  Palliative Care Primary Diagnosis  Other (Comment)  Date Notified  11/11/17  Palliative Care Type  New Palliative care  Reason for referral  Clarify Goals of Care, Psychosocial or Spiritual support  Date of Admission  11/19/2017  Date first seen by Palliative Care  11/12/17  # of days Palliative referral response time  1 Day(s)  # of days IP  prior to Palliative referral  1  Clinical Assessment  Palliative Performance Scale Score  50%  Pain Max last 24 hours  Not able to report  Pain Min Last 24 hours  Not able to report  Dyspnea Max Last 24 Hours  Not able to report  Dyspnea Min Last 24 hours  Not able to report  Nausea Max Last 24 Hours  Not able to report  Nausea Min Last 24 Hours  Not able to report  Anxiety Max Last 24 Hours  Not able to report  Anxiety Min Last 24 Hours  Not able to report  Other Max Last 24 Hours  Not able to report  Psychosocial & Spiritual Assessment  Palliative Care Outcomes  Patient/Family meeting held?  Yes  Who was at the meeting?  pt, wife and son  Patient/Family wishes: Interventions discontinued/not started   Mechanical Ventilation, Vasopressors, Trach, PEG, Hemodialysis, Tube feedings/TPN  Palliative Care follow-up planned  No      Time In: 1400 Time Out: 1455 Time Total: 55 min Greater than 50%  of this time was spent counseling and coordinating care related to the above assessment and plan.  Signed by: Dory Horn, NP   Please contact Palliative Medicine Team phone at [phone] for questions and concerns.  For individual provider: See Shea Evans

## 2017-11-13 ENCOUNTER — Inpatient Hospital Stay (HOSPITAL_COMMUNITY): Payer: Medicare HMO

## 2017-11-13 DIAGNOSIS — R0789 Other chest pain: Secondary | ICD-10-CM | POA: Diagnosis present

## 2017-11-13 DIAGNOSIS — R0603 Acute respiratory distress: Secondary | ICD-10-CM | POA: Diagnosis not present

## 2017-11-13 DIAGNOSIS — I10 Essential (primary) hypertension: Secondary | ICD-10-CM | POA: Diagnosis present

## 2017-11-13 DIAGNOSIS — K56609 Unspecified intestinal obstruction, unspecified as to partial versus complete obstruction: Secondary | ICD-10-CM | POA: Diagnosis present

## 2017-11-13 DIAGNOSIS — Z79899 Other long term (current) drug therapy: Secondary | ICD-10-CM | POA: Diagnosis not present

## 2017-11-13 DIAGNOSIS — K4030 Unilateral inguinal hernia, with obstruction, without gangrene, not specified as recurrent: Secondary | ICD-10-CM | POA: Diagnosis present

## 2017-11-13 DIAGNOSIS — J690 Pneumonitis due to inhalation of food and vomit: Secondary | ICD-10-CM | POA: Diagnosis not present

## 2017-11-13 DIAGNOSIS — Z515 Encounter for palliative care: Secondary | ICD-10-CM | POA: Diagnosis present

## 2017-11-13 DIAGNOSIS — I2510 Atherosclerotic heart disease of native coronary artery without angina pectoris: Secondary | ICD-10-CM | POA: Diagnosis present

## 2017-11-13 DIAGNOSIS — J9601 Acute respiratory failure with hypoxia: Secondary | ICD-10-CM | POA: Diagnosis not present

## 2017-11-13 DIAGNOSIS — N179 Acute kidney failure, unspecified: Secondary | ICD-10-CM | POA: Diagnosis not present

## 2017-11-13 DIAGNOSIS — R011 Cardiac murmur, unspecified: Secondary | ICD-10-CM | POA: Diagnosis not present

## 2017-11-13 DIAGNOSIS — I493 Ventricular premature depolarization: Secondary | ICD-10-CM | POA: Diagnosis present

## 2017-11-13 DIAGNOSIS — R0902 Hypoxemia: Secondary | ICD-10-CM | POA: Diagnosis not present

## 2017-11-13 DIAGNOSIS — H9190 Unspecified hearing loss, unspecified ear: Secondary | ICD-10-CM | POA: Diagnosis present

## 2017-11-13 DIAGNOSIS — R1110 Vomiting, unspecified: Secondary | ICD-10-CM | POA: Diagnosis not present

## 2017-11-13 DIAGNOSIS — D72828 Other elevated white blood cell count: Secondary | ICD-10-CM | POA: Diagnosis present

## 2017-11-13 DIAGNOSIS — E876 Hypokalemia: Secondary | ICD-10-CM | POA: Diagnosis not present

## 2017-11-13 DIAGNOSIS — E860 Dehydration: Secondary | ICD-10-CM | POA: Diagnosis not present

## 2017-11-13 DIAGNOSIS — R1013 Epigastric pain: Secondary | ICD-10-CM | POA: Diagnosis not present

## 2017-11-13 DIAGNOSIS — R0609 Other forms of dyspnea: Secondary | ICD-10-CM | POA: Diagnosis not present

## 2017-11-13 DIAGNOSIS — Z8249 Family history of ischemic heart disease and other diseases of the circulatory system: Secondary | ICD-10-CM | POA: Diagnosis not present

## 2017-11-13 DIAGNOSIS — Z66 Do not resuscitate: Secondary | ICD-10-CM | POA: Diagnosis present

## 2017-11-13 DIAGNOSIS — R079 Chest pain, unspecified: Secondary | ICD-10-CM | POA: Diagnosis present

## 2017-11-13 DIAGNOSIS — F0390 Unspecified dementia without behavioral disturbance: Secondary | ICD-10-CM | POA: Diagnosis present

## 2017-11-13 DIAGNOSIS — Z951 Presence of aortocoronary bypass graft: Secondary | ICD-10-CM | POA: Diagnosis not present

## 2017-11-13 DIAGNOSIS — R000 Tachycardia, unspecified: Secondary | ICD-10-CM | POA: Diagnosis not present

## 2017-11-13 LAB — CULTURE, GROUP A STREP (THRC)

## 2017-11-13 LAB — GLUCOSE, CAPILLARY: GLUCOSE-CAPILLARY: 151 mg/dL — AB (ref 65–99)

## 2017-11-13 MED ORDER — PANTOPRAZOLE SODIUM 40 MG PO TBEC: 40.0000 mg | DELAYED_RELEASE_TABLET | Freq: Every day | ORAL | 0 refills | Status: AC

## 2017-11-13 MED ORDER — PROMETHAZINE HCL 25 MG/ML IJ SOLN
12.5000 mg | Freq: Once | INTRAMUSCULAR | Status: AC
  Administered 2017-11-13: 12.5 mg via INTRAVENOUS
  Filled 2017-11-13: qty 1

## 2017-11-13 MED ORDER — SENNA 8.6 MG PO TABS: 1.0000 | ORAL_TABLET | Freq: Every day | ORAL | 0 refills | Status: AC

## 2017-11-13 MED ORDER — POTASSIUM CHLORIDE 20 MEQ PO PACK
40.0000 meq | PACK | ORAL | Status: AC
  Administered 2017-11-13: 40 meq via ORAL
  Filled 2017-11-13 (×2): qty 2

## 2017-11-13 NOTE — Progress Notes (Signed)
Pt was vomiting with large amount, brownish in color. Pt denies chest pain, not in respiratory distress. MD on call paged and ordered one time dose of Phenergan 12.5 mg IV. Will continue to monitor pt.

## 2017-11-13 NOTE — Progress Notes (Signed)
Was to be discharged per RN patient with nausea and emesis in the trash can.  IV Zofran given.  Check a KUB.  Downgraded to full liquid diet.  Will cancel discharge and monitor.   No charge.

## 2017-11-13 NOTE — Discharge Summary (Signed)
Physician Discharge Summary  Benjamin Petersen YNW:[id number] DOB: 23-Jun-1924 DOA: 11-15-17  PCP: Burton Apleyoberts, Ronald, MD  Admit date: 11-15-17 Discharge date: 11/13/2017  Time spent: 50 minutes  Recommendations for Outpatient Follow-up:  1. Follow-up with Burton Apleyoberts, Ronald, MD on 11/28/2017.  On follow-up patient will need a basic metabolic profile done to follow-up on electrolytes and renal function.   Discharge Diagnoses:  Principal Problem:   SBO (small bowel obstruction) (HCC) Active Problems:   Chest pain   Essential hypertension   Leukocytosis   Epigastric abdominal pain   Left inguinal hernia   Goals of care, counseling/discussion   Palliative care by specialist   Discharge Condition: Stable and improved  Diet recommendation: Heart healthy  Filed Weights   11/11/17 0202 11/12/17 0600 11/13/17 0600  Weight: 61.4 kg (135 lb 6.4 oz) 62 kg (136 lb 11.2 oz) 63.3 kg (139 lb 8 oz)    History of present illness:  Per Dr Sharlene MottsNiu [name] is a 84 y.o. male with medical history significant of hypertension, CAD, CABG in 1992, who presented with chest pain and epigastric abdominal pain.  Pt stated that has had several episodes of chest pain since this morning. His pain is located in the frontal lower chest and also in the epigastric area. The pain was intermittent, moderate, sharp, nonradiating. Patient reported to ED physician that he had a mild SOB, but denied any shortness breath to me. No calf pain. Pt was given one dose ASA and NGT patch. He is CP free now.  He has mild cough with clear mucus production. He also had sore throat, but no runny nose. Denied fever or chills. Patient did not have nausea, vomiting, diarrhea, abdominal pain, symptoms of UTI. He had rashes in front chest wall, which has been going on for about a week and are itchy. No new medication recently.   ED Course: pt was found to have WBC 13.1, negative flu PCR, negative rapid strep test, lipase 26, negative  troponin, creatinine 1.17, GFR>60, blood pressure 182/91, no tachycardia, temperature normal, oxygen saturation section 97% on room air. Chest x-ray showed streaks of bright basilar opacity. Pending CT-abd/pelivs. Pt is placed on telemetry bed for observation.     Hospital Course:  1 left inguinal hernia with small bowel obstruction Patient presented with small bowel obstruction secondary to left inguinal hernia.  Patient was admitted.  Patient was seen in consultation by general surgery and patient refused any surgical intervention at this time to reduce the hernia.  General surgery explained to patient and family that if not repaired bowel obstruction could worsen leading to death.  Patient assessed by general surgery again during the hospitalization, patient still not interested in any surgical intervention at this time.  Inguinal hernia reduced.  Per nurse tech patient noted to have bowel movement this morning.  Patient's diet has been advanced to clear liquids per general surgery which he tolerated.  Diet was subsequently advanced to a soft diet which he tolerated.  Patient was initially gently hydrated which was subsequently discontinued.  Palliative care consultation was also obtained.  Patient was seen by PT/OT and patient refusing home health therapies.  Patient was discharged home in stable and improved condition is to follow-up with PCP in the outpatient setting.  2.  Hypertension On admission due to patient's n.p.o. status patient was placed on IV enalapril for better blood pressure control.  Blood pressure improved.  IV enalapril was subsequently discontinued.  IV fluids were saline locked.  Patient's home dose  Cozaar will be resumed on discharge.   3.  Leukocytosis Likely a reactive leukocytosis.  WBC trended down.  Patient afebrile.  Urinalysis unremarkable.  Chest x-ray negative for any acute infiltrates.  No need for antibiotics.    4.  Chest pain Likely referred pain secondary  to problem #1.  Patient with no chest pain during the hospitalization.  Patient with prior history of coronary artery disease status post CABG.  Cardiac enzymes negative x3.  2D echo with EF of 55-60%, no wall motion abnormalities.  No further workup needed at this time.  Patient was initially placed on IV enalapril for better blood pressure control.  Patient improved clinically.  IV enalapril was subsequently discontinued.  Home dose Cozaar will be resumed on discharge.   5.  Hypokalemia Repleted.     Procedures:  Chest x-ray 2017/11/19  CT abdomen and pelvis 11/11/2017  2D echo 11/11/2017      Consultations:  General surgery: Dr. Luisa Hart 11/11/2017  Palliative care Eduard Roux, NP/Dr. Linna Darner 11/12/2017      Discharge Exam: Vitals:   11/13/17 0600 11/13/17 1220  BP: (!) 131/47 (!) 126/50  Pulse: 69 65  Resp: 18 18  Temp: 99.1 F (37.3 C) 97.9 F (36.6 C)  SpO2: 95% 94%    General: NAD Cardiovascular: RRR Respiratory: CTAB  Discharge Instructions   Discharge Instructions    Diet - low sodium heart healthy   Complete by:  As directed    Increase activity slowly   Complete by:  As directed      Allergies as of 11/13/2017   No Known Allergies     Medication List    TAKE these medications   acetaminophen 325 MG tablet Commonly known as:  TYLENOL Take 325-650 mg by mouth every 6 (six) hours as needed (for pain or headaches).   calcium carbonate 500 MG chewable tablet Commonly known as:  TUMS - dosed in mg elemental calcium Chew 1-2 tablets by mouth as needed for indigestion or heartburn.   chlorthalidone 25 MG tablet Commonly known as:  HYGROTON Take 12.5 mg by mouth daily.   losartan 50 MG tablet Commonly known as:  COZAAR Take 50 mg by mouth at bedtime.   ONE-A-DAY PROACTIVE 65+ Tabs Take 1 tablet by mouth daily.   pantoprazole 40 MG tablet Commonly known as:  PROTONIX Take 1 tablet (40 mg total) by mouth daily at 12 noon. Start taking  on:  11/14/2017   senna 8.6 MG Tabs tablet Commonly known as:  SENOKOT Take 1 tablet (8.6 mg total) by mouth at bedtime.            Durable Medical Equipment  (From admission, onward)        Start     Ordered   11/13/17 1233  For home use only DME 3 n 1  Once     11/13/17 1234     No Known Allergies Follow-up Information    Burton Apley, MD. Schedule an appointment as soon as possible for a visit in 2 week(s).   Specialty:  Internal Medicine Contact information: [address] [place] [address] [phone]            The results of significant diagnostics from this hospitalization (including imaging, microbiology, ancillary and laboratory) are listed below for reference.    Significant Diagnostic Studies: Dg Chest 2 View  Result Date: 2017-11-19 CLINICAL DATA:  Chest and epigastric pain. EXAM: CHEST - 2 VIEW COMPARISON:  None. FINDINGS: Post median  sternotomy. Heart size normal. There is aortic atherosclerosis. Mild vascular congestion. Streaky bibasilar opacities. There is an azygos fissure. No definite pleural fluid or pneumothorax. IMPRESSION: 1. Streaky bibasilar opacities, likely atelectasis. 2. Post median sternotomy with aortic atherosclerosis. Mild vascular congestion. Electronically Signed   By: Rubye Oaks M.D.   On: 11/02/2017 22:57   Ct Abdomen Pelvis W Contrast  Result Date: 11/11/2017 CLINICAL DATA:  Initial evaluation for acute epigastric abdominal pain for 1 day. EXAM: CT ABDOMEN AND PELVIS WITH CONTRAST TECHNIQUE: Multidetector CT imaging of the abdomen and pelvis was performed using the standard protocol following bolus administration of intravenous contrast. CONTRAST:  ISOVUE-300 IOPAMIDOL (ISOVUE-300) INJECTION 61% COMPARISON:  None available. FINDINGS: Lower chest: Scattered bibasilar atelectatic and/or fibrotic changes present within the lung bases. Superimposed bronchiectasis present at the medial left lung base. No pleural or  pericardial effusion. Prominent coronary artery calcifications noted. Hepatobiliary: Multiple scattered cysts noted within the liver, largest discrete of which position within the medial left hepatic lobe in measures approximately 2 cm. Gallbladder irregular invaginating upon the adjacent hepatic parenchyma without associated inflammatory changes. Mild intra and extrahepatic biliary dilatation, suspected to be related to advanced age. Pancreas: Pancreas demonstrates no acute abnormality. Scattered punctate calcifications at the uncinate process of the pancreas may reflect sequelae of chronic pancreatitis. No acute peripancreatic inflammation. No significant pancreatic ductal dilatation. Spleen: Spleen within normal limits. Adrenals/Urinary Tract: Diffuse thickening of the adrenal glands noted bilaterally without discrete mass. Kidneys equal in size with symmetric enhancement. 19 mm left renal cyst noted. No nephrolithiasis, hydronephrosis, or focal enhancing renal mass. No appreciable hydroureter. Partially distended bladder within normal limits. Stomach/Bowel: Intraluminal fluid density noted within the distal esophagus. Small hiatal hernia. Stomach moderately distended with fluid in the gastric lumen. There are multiple dilated loops of bowel scattered throughout the abdomen with internal air-fluid levels, consistent with small bowel obstruction. These measure up to approximately 3 cm in diameter. There is a left inguinal hernia containing a short segment of fluid-filled small bowel (series 3, image 71). Small bowel is dilated proximally, and decompressed distally as it courses out of the hernia sac, consistent with small bowel obstruction. Distal small bowel is decompressed to the level of the terminal ileum. Moderate stool within the colon which is otherwise unremarkable without acute inflammatory changes. No findings to suggest acute appendicitis. Vascular/Lymphatic: Severe aorto bi-iliac atherosclerotic  disease. No aneurysm. Mesenteric vessels are patent proximally. No adenopathy. Reproductive: Prostate somewhat enlarged measuring 5.1 cm in transverse diameter. Other: No free intraperitoneal air. Small volume free fluid within the abdomen and adjacent to the liver, likely reactive. Sequelae of probable prior inguinal hernia repair noted bilaterally, suggesting at the current left inguinal hernia is recurrent. Musculoskeletal: No acute osseus abnormality. Visualized osseous structures demonstrate a mottled appearance without discrete lytic or blastic osseous lesion. 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 IMPRESSION: 1. Left inguinal hernia containing the short-segment of small bowel with secondary small bowel obstruction. 2. Associated small volume free fluid within the abdomen, likely reactive. 3. Advanced 3 vessel coronary artery calcifications with severe atherosclerosis. 4. Additional incidental findings as above. Electronically Signed   By: Rise Mu M.D.   On: 11/11/2017 00:52    Microbiology: Recent Results (from the past 240 hour(s))  Culture, blood (Routine X 2) w Reflex to ID Panel     Status: None (Preliminary result)   Collection Time: 11/11/17 12:10 AM  Result Value Ref Range Status   Specimen Description BLOOD RIGHT ANTECUBITAL  Final   Special Requests   Final    BOTTLES DRAWN AEROBIC AND ANAEROBIC Blood Culture adequate volume   Culture   Final    NO GROWTH 2 DAYS Performed at [facility], [address], [place], [place] [address]    Report Status PENDING  Incomplete  Culture, blood (Routine X 2) w Reflex to ID Panel     Status: None (Preliminary result)   Collection Time: 11/11/17 12:25 AM  Result Value Ref Range Status   Specimen Description BLOOD LEFT ANTECUBITAL  Final   Special Requests   Final    BOTTLES DRAWN AEROBIC AND ANAEROBIC Blood Culture adequate volume   Culture   Final    NO GROWTH 2 DAYS Performed at [facility], [address], [place], [place] [address]    Report Status PENDING  Incomplete  Rapid Strep Screen (Not at [facility])     Status: None   Collection Time: 11/11/17 12:50 AM  Result Value Ref Range Status   Streptococcus, Group A Screen (Direct) NEGATIVE NEGATIVE Final    Comment: (NOTE) A Rapid Antigen test may result negative if the antigen level in the sample is below the detection level of this test. The FDA has not cleared this test as a stand-alone test therefore the rapid antigen negative result has reflexed to a Group A Strep culture. Performed at [facility], [address] [place], [place] [address]   Culture, group A strep     Status: None   Collection Time: 11/11/17 12:50 AM  Result Value Ref Range Status   Specimen Description THROAT  Final   Special Requests NONE Reflexed from [id number]  Final   Culture   Final    NO GROUP A STREP (S.PYOGENES) ISOLATED Performed at [facility], [address], [place], [place] [address]    Report Status 11/13/2017 FINAL  Final     Labs: Basic Metabolic Panel: Recent Labs  Lab 10/28/2017 2150 11/11/17 1101 11/12/17 0738  NA 139 138 138  K 3.9 4.1 3.3*  CL 98* 103 102  CO2 27 23 27   GLUCOSE 191* 132* 93  BUN 28* 24* 24*  CREATININE 1.17 1.01 1.02  CALCIUM 9.9 9.2 8.6*  MG  --  1.9 1.9   Liver Function Tests: Recent Labs  Lab 11/11/17 0010 11/11/17 1101  AST 29 32  ALT 20 21  ALKPHOS 68 74  BILITOT 0.7 0.6  PROT 6.6 6.5  ALBUMIN 3.8 3.8   Recent Labs  Lab 11/11/17 0010  LIPASE 26   No results for input(s): AMMONIA in the last 168 hours. CBC: Recent Labs  Lab 11/12/2017 2150 11/11/17 1101 11/12/17 0738  WBC 13.3* 14.3* 9.5  NEUTROABS  --  13.0*  --   HGB 11.5* 11.3* 10.3*  HCT 35.7* 35.0* 31.2*  MCV 97.0 95.9 96.9  PLT 225 208 183   Cardiac Enzymes: Recent Labs  Lab 11/11/17 0010 11/11/17 0442 11/11/17 1101  TROPONINI <0.03 <0.03 0.04*   BNP: BNP (last 3 results) No results  for input(s): BNP in the last 8760 hours.  ProBNP (last 3 results) No results for input(s): PROBNP in the last 8760 hours.  CBG: No results for input(s): GLUCAP in the last 168 hours.     Signed:  Ramiro Harvest MD.  [facility] 11/13/2017, 2:58 PM

## 2017-11-13 NOTE — Progress Notes (Signed)
Pt was given ensure to try as pt unable to eat dinner and when on rounds, noted to have emesis of ensure in emesis bag, unable to give zofran due to time, amor RN paged MD to discuss plan

## 2017-11-13 NOTE — Progress Notes (Signed)
Nurse received call from MD to advise of pt discharge, pt had emesis of undigested food when nurse arrived to room " I ate too much or maybe its all these meds, pt rquest nause med, zofran iv given as per prn order, paged Dr Janee Mornhompson to advise of pt having nausea and vomitting

## 2017-11-13 NOTE — Evaluation (Signed)
Physical Therapy Evaluation Patient Details Name: Benjamin Petersen MRN: [id number] DOB: [birth year] Today's Date: 11/13/2017   History of Present Illness  84yo male presenting with chest pain and abdominal pain. Diagnosed with inguinal hernia possibly causing SBO. Troponins are negative. PMH CAD, CABG, HTN   Clinical Impression  Patient received in bed, pleasant and willing to participate with PT evaluation this morning. He is able to complete functional bed mobility, transfers, and gait with min guard and RW today but does demonstrate mild unsteadiness with occasional posterior lean, able to correct without additional assistance from PT this morning. He reports he mostly feels at his baseline level of function and is most concerned with the pain and discomfort in his stomach, RN aware. He declines up to chair and was left in bed with all needs met, alarm activated this morning. He will continue to benefit form skilled PT services in the acute setting, and also from skilled HHPT services to further address functional deficits moving forward.     Follow Up Recommendations Home health PT;Supervision for mobility/OOB    Equipment Recommendations  3in1 (PT);Other (comment)(bedside commode )    Recommendations for Other Services       Precautions / Restrictions Precautions Precautions: Fall Restrictions Weight Bearing Restrictions: No      Mobility  Bed Mobility Overal bed mobility: Needs Assistance Bed Mobility: Supine to Sit;Sit to Supine     Supine to sit: Min guard Sit to supine: Min guard   General bed mobility comments: min guard for safety, extended time   Transfers Overall transfer level: Needs assistance Equipment used: Rolling walker (2 wheeled) Transfers: Sit to/from Stand Sit to Stand: Min guard         General transfer comment: cues for safety, mild unsteadiness noted, occasional mild postererior lean   Ambulation/Gait Ambulation/Gait assistance: Min  guard Ambulation Distance (Feet): 50 Feet Assistive device: Rolling walker (2 wheeled) Gait Pattern/deviations: Step-through pattern;Decreased step length - right;Decreased step length - left;Decreased stride length;Drifts right/left;Trunk flexed;Narrow base of support     General Gait Details: mild unsteadiness but able to maintain upright without additional assist from PT with walker   Stairs            Wheelchair Mobility    Modified Rankin (Stroke Patients Only)       Balance Overall balance assessment: Needs assistance Sitting-balance support: Bilateral upper extremity supported;Feet supported Sitting balance-Leahy Scale: Good     Standing balance support: Bilateral upper extremity supported;During functional activity Standing balance-Leahy Scale: Fair Standing balance comment: reliatn on UE support                              Pertinent Vitals/Pain Pain Assessment: Faces Faces Pain Scale: Hurts even more Pain Location: bowels  Pain Descriptors / Indicators: Aching;Sore Pain Intervention(s): Limited activity within patient's tolerance;Monitored during session    [place] expects to be discharged to:: Private residence Living Arrangements: Spouse/significant other Available Help at Discharge: Family Type of Home: House Home Access: Stairs to enter Entrance Stairs-Rails: Can reach both Entrance Stairs-Number of Steps: 1 Home Layout: One level Home Equipment: Environmental consultant - 2 wheels      Prior Function Level of Independence: Independent with assistive device(s)               Hand Dominance        Extremity/Trunk Assessment   Upper Extremity Assessment Upper Extremity Assessment: Defer to OT evaluation  Lower Extremity Assessment Lower Extremity Assessment: Generalized weakness    Cervical / Trunk Assessment Cervical / Trunk Assessment: Kyphotic  Communication   Communication: HOH  Cognition Arousal/Alertness:  Awake/alert Behavior During Therapy: WFL for tasks assessed/performed Overall Cognitive Status: Within Functional Limits for tasks assessed                                        General Comments      Exercises     Assessment/Plan    PT Assessment Patient needs continued PT services  PT Problem List Decreased strength;Decreased mobility;Decreased coordination;Decreased safety awareness;Decreased activity tolerance;Decreased balance       PT Treatment Interventions DME instruction;Therapeutic activities;Gait training;Therapeutic exercise;Patient/family education;Stair training;Balance training;Functional mobility training;Neuromuscular re-education    PT Goals (Current goals can be found in the Care Plan section)  Acute Rehab PT Goals Patient Stated Goal: to feel better  PT Goal Formulation: With patient Time For Goal Achievement: 11/27/17 Potential to Achieve Goals: Fair    Frequency Min 3X/week   Barriers to discharge        Co-evaluation               AM-PAC PT "6 Clicks" Daily Activity  Outcome Measure Difficulty turning over in bed (including adjusting bedclothes, sheets and blankets)?: None Difficulty moving from lying on back to sitting on the side of the bed? : None Difficulty sitting down on and standing up from a chair with arms (e.g., wheelchair, bedside commode, etc,.)?: None Help needed moving to and from a bed to chair (including a wheelchair)?: A Little Help needed walking in hospital room?: A Little Help needed climbing 3-5 steps with a railing? : A Little 6 Click Score: 21    End of Session Equipment Utilized During Treatment: Gait belt Activity Tolerance: Patient tolerated treatment well Patient left: in bed;with call bell/phone within reach;with bed alarm set   PT Visit Diagnosis: Unsteadiness on feet (R26.81);Muscle weakness (generalized) (M62.81)    Time: 1840-3754 PT Time Calculation (min) (ACUTE ONLY): 26  min   Charges:   PT Evaluation $PT Eval Low Complexity: 1 Low PT Treatments $Gait Training: 8-22 mins   PT G Codes:        Deniece Ree PT, DPT, CBIS  Supplemental Physical Therapist [place]   Pager [phone]

## 2017-11-13 NOTE — Progress Notes (Signed)
Occupational Therapy Evaluation Patient Details Name: Benjamin Petersen MRN: [id number] DOB: 01-Mar-1924 Today's Date: 11/13/2017    History of Present Illness 84yo male presenting with chest pain and abdominal pain. Diagnosed with inguinal hernia possibly causing SBO. Troponins are negative. PMH CAD, CABG, HTN    Clinical Impression   PTA, pt lived at home with his wife and was modified independent with ADL and mobility @ RW level. Pt currenlty requires min A with mobility and LB ADL. If eligible, feel pt would benefit form HHOT to maximize functional level of independence, reduce risk o falls and decrease burden of care on caregiver. Will follow acutely to facilitae safe DC home.  Follow Up Recommendations  Home health OT;Supervision/Assistance - 24 hour    Equipment Recommendations  Tub/shower bench    Recommendations for Other Services       Precautions / Restrictions Precautions Precautions: Fall Restrictions Weight Bearing Restrictions: No      Mobility Bed Mobility Overal bed mobility: Needs Assistance Bed Mobility: Supine to Sit;Sit to Supine     Supine to sit: Min guard Sit to supine: Min guard   General bed mobility comments: min guard for safety, extended time   Transfers Overall transfer level: Needs assistance Equipment used: Rolling walker (2 wheeled) Transfers: Sit to/from Stand Sit to Stand: Min guard         General transfer comment: cues for safety, mild unsteadiness noted, occasional mild postererior lean     Balance Overall balance assessment: Needs assistance Sitting-balance support: Bilateral upper extremity supported;Feet supported Sitting balance-Leahy Scale: Good     Standing balance support: Bilateral upper extremity supported;During functional activity Standing balance-Leahy Scale: Fair Standing balance comment: reliatn on UE support                            ADL either performed or assessed with clinical judgement   ADL  Overall ADL's : Needs assistance/impaired Eating/Feeding: Modified independent   Grooming: Set up;Sitting   Upper Body Bathing: Set up;Sitting   Lower Body Bathing: Minimal assistance;Sit to/from stand   Upper Body Dressing : Set up;Sitting   Lower Body Dressing: Minimal assistance;Sit to/from stand   Toilet Transfer: Minimal assistance           Functional mobility during ADLs: Minimal assistance       Vision Baseline Vision/History: Wears glasses       Perception     Praxis      Pertinent Vitals/Pain Pain Assessment: Faces Faces Pain Scale: Hurts a little bit Pain Location: stomach Pain Descriptors / Indicators: Discomfort     Hand Dominance     Extremity/Trunk Assessment Upper Extremity Assessment Upper Extremity Assessment: Generalized weakness   Lower Extremity Assessment Lower Extremity Assessment: Defer to PT evaluation   Cervical / Trunk Assessment Cervical / Trunk Assessment: Kyphotic   Communication Communication Communication: HOH   Cognition Arousal/Alertness: Awake/alert Behavior During Therapy: WFL for tasks assessed/performed Overall Cognitive Status: Within Functional Limits for tasks assessed                                     General Comments       Exercises     Shoulder Instructions      Home Living Family/patient expects to be discharged to:: Private residence Living Arrangements: Spouse/significant other Available Help at Discharge: Family Type of Home: House Home Access: Stairs to  enter Entrance Stairs-Number of Steps: 1 Entrance Stairs-Rails: Can reach both Home Layout: One level     Bathroom Shower/Tub: Chief Strategy Officer: Standard Bathroom Accessibility: Yes How Accessible: Accessible via walker Home Equipment: Walker - 2 wheels          Prior Functioning/Environment Level of Independence: Independent with assistive device(s)                 OT Problem List:  Decreased strength;Decreased activity tolerance;Impaired balance (sitting and/or standing);Decreased safety awareness;Decreased knowledge of use of DME or AE;Pain      OT Treatment/Interventions: Self-care/ADL training;DME and/or AE instruction;Therapeutic activities;Patient/family education    OT Goals(Current goals can be found in the care plan section) Acute Rehab OT Goals Patient Stated Goal: to feel better and go home OT Goal Formulation: With patient Time For Goal Achievement: 11/27/17 Potential to Achieve Goals: Good  OT Frequency: Min 2X/week   Barriers to D/C:            Co-evaluation              AM-PAC PT "6 Clicks" Daily Activity     Outcome Measure Help from another person eating meals?: None Help from another person taking care of personal grooming?: None Help from another person toileting, which includes using toliet, bedpan, or urinal?: A Little Help from another person bathing (including washing, rinsing, drying)?: A Little Help from another person to put on and taking off regular upper body clothing?: None Help from another person to put on and taking off regular lower body clothing?: A Little 6 Click Score: 21   End of Session Nurse Communication: Mobility status  Activity Tolerance: Patient tolerated treatment well Patient left: in bed;with call bell/phone within reach;with bed alarm set  OT Visit Diagnosis: Unsteadiness on feet (R26.81);Muscle weakness (generalized) (M62.81);Other symptoms and signs involving cognitive function                Time: 1315-1330 OT Time Calculation (min): 15 min Charges:  OT General Charges $OT Visit: 1 Visit OT Evaluation $OT Eval Low Complexity: 1 Low G-Codes:     Vincent Ehrler, OT/L  [phone] 11/13/2017  Garlin Batdorf,HILLARY 11/13/2017, 2:41 PM

## 2017-11-13 NOTE — Care Management Note (Signed)
Case Management Note  Patient Details  Name: Benjamin Petersen MRN: [id number] Date of Birth: 1923/10/03  Subjective/Objective:   SBO               Action/Plan: Patient lives at home with spouse; has private insurance with [employer] with prescription drug coverage; he does not remember which pharmacy that he use. [facility] but refused and also refused 3:1; CM will continue to follow for progression of care.  Expected Discharge Date:  11/14/17               Expected Discharge Plan:  Home w Home Health Services  Discharge planning Services  CM Consult  Choice offered to:  Patient  HH Arranged:  Patient Refused HH  Status of Service:  In process, will continue to follow  Reola MosherChandler, Lonisha Bobby L, RN,MHA,BSN [phone] 11/13/2017, 2:42 PM

## 2017-11-14 ENCOUNTER — Inpatient Hospital Stay (HOSPITAL_COMMUNITY): Payer: Medicare HMO

## 2017-11-14 DIAGNOSIS — R1013 Epigastric pain: Secondary | ICD-10-CM

## 2017-11-14 DIAGNOSIS — R1110 Vomiting, unspecified: Secondary | ICD-10-CM

## 2017-11-14 LAB — CBC
HEMATOCRIT: 39.8 % (ref 39.0–52.0)
HEMOGLOBIN: 13.4 g/dL (ref 13.0–17.0)
MCH: 31.4 pg (ref 26.0–34.0)
MCHC: 33.7 g/dL (ref 30.0–36.0)
MCV: 93.2 fL (ref 78.0–100.0)
Platelets: 236 10*3/uL (ref 150–400)
RBC: 4.27 MIL/uL (ref 4.22–5.81)
RDW: 13 % (ref 11.5–15.5)
WBC: 12.3 10*3/uL — ABNORMAL HIGH (ref 4.0–10.5)

## 2017-11-14 LAB — BASIC METABOLIC PANEL
ANION GAP: 13 (ref 5–15)
BUN: 27 mg/dL — ABNORMAL HIGH (ref 6–20)
CALCIUM: 8.7 mg/dL — AB (ref 8.9–10.3)
CHLORIDE: 101 mmol/L (ref 101–111)
CO2: 25 mmol/L (ref 22–32)
CREATININE: 1.11 mg/dL (ref 0.61–1.24)
GFR calc non Af Amer: 56 mL/min — ABNORMAL LOW (ref >60)
GLUCOSE: 179 mg/dL — AB (ref 65–99)
Potassium: 4 mmol/L (ref 3.5–5.1)
Sodium: 139 mmol/L (ref 135–145)

## 2017-11-14 MED ORDER — SODIUM CHLORIDE 0.9 % IV SOLN
INTRAVENOUS | Status: DC
  Administered 2017-11-14 (×2): via INTRAVENOUS

## 2017-11-14 MED ORDER — PANTOPRAZOLE SODIUM 40 MG IV SOLR
40.0000 mg | INTRAVENOUS | Status: DC
  Administered 2017-11-14 – 2017-11-15 (×2): 40 mg via INTRAVENOUS
  Filled 2017-11-14 (×2): qty 40

## 2017-11-14 MED ORDER — PROCHLORPERAZINE EDISYLATE 5 MG/ML IJ SOLN
10.0000 mg | Freq: Four times a day (QID) | INTRAMUSCULAR | Status: DC | PRN
  Administered 2017-11-14: 10 mg via INTRAVENOUS
  Filled 2017-11-14 (×3): qty 2

## 2017-11-14 NOTE — Progress Notes (Signed)
Pt continues with emesis, NG tube [id number] inserted by charge nurse Tiffany, portable xray ordered to confirm placement, brown liquid noted in tube, son at bedside and educated on purpose of  NG to decompress bowel, give bowel rest and give IVF while pt is NPO. Handout given on SBO and NGT, all questions entertained and answered, family and md to have family meeting at [facility]4pm tomorrow

## 2017-11-14 NOTE — Progress Notes (Signed)
PROGRESS NOTE    Benjamin Petersen  UJW:[id number] DOB: 1924/02/07 DOA: Dec 03, 2017 PCP: Burton Apleyoberts, Ronald, MD   Brief Narrative:  Patient is a 84 year old gentleman history of hypertension, coronary artery disease status post CABG 1992 presented to the ED with chest pain epigastric abdominal pain ongoing since the morning of admission.  Pain located in the frontal lower chest and in the epigastric region intermittent moderate sharp nonradiating.  Per ED physician patient also with complaints of some mild shortness of breath however denies shortness of breath admitting physician.  Patient given aspirin nitroglycerin patch remained chest pain-free.  Patient also with cough with clear mucus production.  Patient with no signs or symptoms of UTI.  The abdomen and pelvis done concerning for left inguinal hernia with small bowel obstruction.  Patient seen in consultation by general surgery however patient is refusing any surgical intervention at this time.  She is also refused manual reduction as he had stated to general surgeon that this was tried before.   Assessment & Plan:   Principal Problem:   SBO (small bowel obstruction) (HCC) Active Problems:   Chest pain   Essential hypertension   Leukocytosis   Epigastric abdominal pain   Left inguinal hernia   Goals of care, counseling/discussion   Palliative care by specialist   Small bowel obstruction (HCC)   Vomiting  #1 left inguinal hernia with small bowel obstruction Patient has been seen in consultation by general surgery and patient refusing any surgical intervention at time to reduce the hernia.  General surgery explained to patient and family that if not repaired bowel obstruction could worsen leading to death.  Patient assessed by general surgery again today patient still not interested in any surgical intervention at this time.  Inguinal hernia reduced.  Per nurse tech patient noted to have bowel movement the morning of 11/12/2017.  Patient's  diet was advanced to a soft diet which he initially tolerated and subsequently to being discharged patient had some nausea and emesis with some abdominal distention.  Repeat abdominal films done consistent with mechanical small bowel obstruction.  Patient currently n.p.o.  Patient with some emesis.  Patient still insistent on not having any surgery done.  Place a NG tube to low intermittent suction.  Placed on gentle hydration.  Antiemetics.  Supportive care.  Palliative care following.  2.  Hypertension Blood pressure stable.  Discontinued IV enalapril.  Discontinued Cozaar.  Placed on gentle hydration as patient back with small bowel obstruction.   3.  Leukocytosis Likely a reactive leukocytosis.  WBC trending down.  Patient afebrile.  Urinalysis unremarkable.  Chest x-ray negative for any acute infiltrates.  No need for antibiotics.  Follow.    4.  Chest pain Likely referred pain secondary to problem #1.  Patient with no overt chest pain at this time.  Patient with prior history of coronary artery disease status post CABG.  Cardiac enzymes negative x3.  2D echo with EF of 55-60%, no wall motion abnormalities.  No further workup needed at this time.  Discontinued IV enalapril.  Discontinue Cozaar.  5.  Hypokalemia Repleted.   DVT prophylaxis: SCDs Code Status: DNR Family Communication: Updated patient, wife and son at bedside. Disposition Plan: Likely home with home health if improves and small bowel obstruction resolved versus residential hospice/comfort measures.    Consultants:   General surgery: Dr. Luisa Hartornett 11/11/2017  Palliative care Eduard RouxSarah Bullard, NP/Dr. Linna DarnerAnwar 11/12/2017    Procedures:   Chest x-ray Dec 03, 2017  CT abdomen and pelvis 11/11/2017  2D echo 11/11/2017  Abdominal films 11/13/2017  Antimicrobials:   None   Subjective: Hernia noted on exam.  Patient last night and this morning with nausea and emesis.  Abdomen is distended.  Patient is still insistent on not  having any surgery done.   Objective: Vitals:   11/13/17 1220 11/13/17 2008 11/14/17 0505 11/14/17 1137  BP: (!) 126/50 (!) 167/56 (!) 143/56 139/64  Pulse: 65 89 75 99  Resp: 18 18 18    Temp: 97.9 F (36.6 C) 97.8 F (36.6 C) 98 F (36.7 C) 98.2 F (36.8 C)  TempSrc: Oral Oral Oral Oral  SpO2: 94% 94% 95% 91%  Weight:   62.2 kg (137 lb 2 oz)   Height:        Intake/Output Summary (Last 24 hours) at 11/14/2017 1815 Last data filed at 11/14/2017 1600 Gross per 24 hour  Intake 443.75 ml  Output 1350 ml  Net -906.25 ml   Filed Weights   11/12/17 0600 11/13/17 0600 11/14/17 0505  Weight: 62 kg (136 lb 11.2 oz) 63.3 kg (139 lb 8 oz) 62.2 kg (137 lb 2 oz)    Examination:  General exam: NAD Respiratory system: Lungs clear to auscultation bilaterally.  No crackles, no rhonchi, no wheezes.  Normal respiratory effort.   Cardiovascular system: RRR.  No JVD, no m/r/g.  No pedal edema.  Gastrointestinal system: Abdomen is soft, nontender, mildly distended, positive bowel sounds.  No rebound.  No guarding.  Genitourinary: Left inguinal hernia noted. Central nervous system: Alert and oriented. No focal neurological deficits. Extremities: Symmetric 5 x 5 power. Skin: No rashes, lesions or ulcers Psychiatry: Judgement and insight appear normal. Mood & affect appropriate.     Data Reviewed: I have personally reviewed following labs and imaging studies  CBC: Recent Labs  Lab 11-20-17 2150 11/11/17 1101 11/12/17 0738 11/14/17 0529  WBC 13.3* 14.3* 9.5 12.3*  NEUTROABS  --  13.0*  --   --   HGB 11.5* 11.3* 10.3* 13.4  HCT 35.7* 35.0* 31.2* 39.8  MCV 97.0 95.9 96.9 93.2  PLT 225 208 183 236   Basic Metabolic Panel: Recent Labs  Lab 2017/11/20 2150 11/11/17 1101 11/12/17 0738 11/14/17 0529  NA 139 138 138 139  K 3.9 4.1 3.3* 4.0  CL 98* 103 102 101  CO2 27 23 27 25   GLUCOSE 191* 132* 93 179*  BUN 28* 24* 24* 27*  CREATININE 1.17 1.01 1.02 1.11  CALCIUM 9.9 9.2 8.6*  8.7*  MG  --  1.9 1.9  --    GFR: Estimated Creatinine Clearance: 38.1 mL/min (by C-G formula based on SCr of 1.11 mg/dL). Liver Function Tests: Recent Labs  Lab 11/11/17 0010 11/11/17 1101  AST 29 32  ALT 20 21  ALKPHOS 68 74  BILITOT 0.7 0.6  PROT 6.6 6.5  ALBUMIN 3.8 3.8   Recent Labs  Lab 11/11/17 0010  LIPASE 26   No results for input(s): AMMONIA in the last 168 hours. Coagulation Profile: Recent Labs  Lab 11/11/17 0121  INR 1.04   Cardiac Enzymes: Recent Labs  Lab 11/11/17 0010 11/11/17 0442 11/11/17 1101  TROPONINI <0.03 <0.03 0.04*   BNP (last 3 results) No results for input(s): PROBNP in the last 8760 hours. HbA1C: No results for input(s): HGBA1C in the last 72 hours. CBG: Recent Labs  Lab 11/13/17 2011  GLUCAP 151*   Lipid Profile: No results for input(s): CHOL, HDL, LDLCALC, TRIG, CHOLHDL, LDLDIRECT in the last 72 hours. Thyroid Function Tests:  No results for input(s): TSH, T4TOTAL, FREET4, T3FREE, THYROIDAB in the last 72 hours. Anemia Panel: No results for input(s): VITAMINB12, FOLATE, FERRITIN, TIBC, IRON, RETICCTPCT in the last 72 hours. Sepsis Labs: Recent Labs  Lab 11/11/17 0442 11/11/17 0616  PROCALCITON <0.10  --   LATICACIDVEN 2.1* 2.3*    Recent Results (from the past 240 hour(s))  Culture, blood (Routine X 2) w Reflex to ID Panel     Status: None (Preliminary result)   Collection Time: 11/11/17 12:10 AM  Result Value Ref Range Status   Specimen Description BLOOD RIGHT ANTECUBITAL  Final   Special Requests   Final    BOTTLES DRAWN AEROBIC AND ANAEROBIC Blood Culture adequate volume   Culture   Final    NO GROWTH 3 DAYS Performed at [facility], [address] [place], [place] [address]    Report Status PENDING  Incomplete  Culture, blood (Routine X 2) w Reflex to ID Panel     Status: None (Preliminary result)   Collection Time: 11/11/17 12:25 AM  Result Value Ref Range Status   Specimen Description BLOOD LEFT  ANTECUBITAL  Final   Special Requests   Final    BOTTLES DRAWN AEROBIC AND ANAEROBIC Blood Culture adequate volume   Culture   Final    NO GROWTH 3 DAYS Performed at [facility], [address], [place], [place] [address]    Report Status PENDING  Incomplete  Rapid Strep Screen (Not at [facility])     Status: None   Collection Time: 11/11/17 12:50 AM  Result Value Ref Range Status   Streptococcus, Group A Screen (Direct) NEGATIVE NEGATIVE Final    Comment: (NOTE) A Rapid Antigen test may result negative if the antigen level in the sample is below the detection level of this test. The FDA has not cleared this test as a stand-alone test therefore the rapid antigen negative result has reflexed to a Group A Strep culture. Performed at [facility], [address] [place], [place] [address]   Culture, group A strep     Status: None   Collection Time: 11/11/17 12:50 AM  Result Value Ref Range Status   Specimen Description THROAT  Final   Special Requests NONE Reflexed from [id number]  Final   Culture   Final    NO GROUP A STREP (S.PYOGENES) ISOLATED Performed at [facility], [address], [place], [place] [address]    Report Status 11/13/2017 FINAL  Final         Radiology Studies: Dg Chest 2 View  Result Date: 11/14/2017 CLINICAL DATA:  Vomiting. History of coronary artery disease and CABG. Nonsmoker EXAM: CHEST - 2 VIEW COMPARISON:  PA and lateral chest x-ray of 11/17/17 FINDINGS: The lungs are adequately inflated. There is hazy increased density in the left lower lobe. There is blunting of the posterior costophrenic angles. The heart and pulmonary vascularity are normal. There are post median sternotomy changes. There is calcification in the wall of the thoracic aorta. There is prominent thoracic kyphosis. IMPRESSION: New small bilateral pleural effusions layering posteriorly. Subtle increased lung markings at the left base worrisome for atelectasis or  pneumonia. Followup PA and lateral chest X-ray is recommended in 3-4 weeks following trial of antibiotic therapy to ensure resolution and exclude underlying malignancy. Previous CABG.  Thoracic aortic atherosclerosis. Electronically Signed   By: David  [place] M.D.   On: 11/14/2017 10:07   Dg Abd 2 Views  Result Date: 11/14/2017 CLINICAL DATA:  Emesis EXAM: ABDOMEN - 2 VIEW COMPARISON:  CT 11/24/2017 FINDINGS: Atelectasis at the lung bases. Sternotomy changes. Multiple dilated loops of small bowel, measuring up to 3.6 cm consistent with a bowel obstruction. Postsurgical changes in the left pelvis/groin. Lucency in the left upper quadrant and across the midline presumably represents gas-filled stomach. : Multiple dilated loops of small bowel, consistent with continued mechanical bowel obstruction. Lucency in the left upper quadrant and across the upper abdomen, probably due to air distended stomach, however could attempt decubitus view or repeat CT if viscus perforation is suspected. Electronically Signed   By: Jasmine Pang M.D.   On: 11/14/2017 00:02        Scheduled Meds: . aspirin EC  325 mg Oral Daily  . multivitamin with minerals  1 tablet Oral Daily  . pantoprazole (PROTONIX) IV  40 mg Intravenous Q24H   Continuous Infusions: . sodium chloride 75 mL/hr at 11/14/17 1301     LOS: 1 day    Time spent: 35 minutes    Ramiro Harvest, MD [facility] Pager [phone]  If 7PM-7AM, please contact night-coverage [email or web address] Password [facility] 11/14/2017, 6:15 PM

## 2017-11-14 NOTE — Progress Notes (Signed)
Patient ID: Benjamin Petersen, male   DOB: May 06, 1924, 84 y.o.   MRN: [id number]  This NP visited patient at the bedside at request of the attending, Dr Janee Mornhompson.  Once again the patient is experiencing nausea and vomiting and is stating that he does not desire any surgical intervention or aggressive measures.  We discussed the natural trajectory and expectations at end of life without surgical intervetnion, we discussed care options.  I placed a call to both his wife and son and had a conversation regarding the current medical situation and the importance of establishing a plan of care.  It is imperative that both the patient and his family understand the current medical situation, understand the likely outcome secondary to no surgical intervention and understand the natural trajectory and anticipated care needs.  Son agrees to bring his mother into the hospital tomorrow for a goals of care meeting at the bedside.   We will meet tomorrow at 4:00.  Questions and concerns addressed   Discussed with Dr Janee Mornhompson  Time in  1600         Time out   1630     Total time spent on the unit was 30 minutes  Greater than 50% of the time was spent in counseling and coordination of care  Lorinda CreedMary Nakeesha Bowler NP  Palliative Medicine Team Team Phone # [phone] Pager [phone]

## 2017-11-14 NOTE — Progress Notes (Signed)
Pt had 300cc of brown liquid emesis and discussed Dr Janee Mornhompson wanted to put a NG tube down to help pull off fluid due to obstruction  asked for medication and if not work then do NG, paged dr Janee Mornthompson to inform of pt request

## 2017-11-15 ENCOUNTER — Inpatient Hospital Stay (HOSPITAL_COMMUNITY): Payer: Medicare HMO

## 2017-11-15 DIAGNOSIS — R0902 Hypoxemia: Secondary | ICD-10-CM

## 2017-11-15 DIAGNOSIS — R0603 Acute respiratory distress: Secondary | ICD-10-CM

## 2017-11-15 DIAGNOSIS — R0609 Other forms of dyspnea: Secondary | ICD-10-CM

## 2017-11-15 DIAGNOSIS — R0600 Dyspnea, unspecified: Secondary | ICD-10-CM

## 2017-11-15 DIAGNOSIS — N179 Acute kidney failure, unspecified: Secondary | ICD-10-CM

## 2017-11-15 DIAGNOSIS — J690 Pneumonitis due to inhalation of food and vomit: Secondary | ICD-10-CM

## 2017-11-15 LAB — CBC
HCT: 36.2 % — ABNORMAL LOW (ref 39.0–52.0)
Hemoglobin: 11.9 g/dL — ABNORMAL LOW (ref 13.0–17.0)
MCH: 31 pg (ref 26.0–34.0)
MCHC: 32.9 g/dL (ref 30.0–36.0)
MCV: 94.3 fL (ref 78.0–100.0)
PLATELETS: 235 10*3/uL (ref 150–400)
RBC: 3.84 MIL/uL — ABNORMAL LOW (ref 4.22–5.81)
RDW: 13.8 % (ref 11.5–15.5)
WBC: 10.1 10*3/uL (ref 4.0–10.5)

## 2017-11-15 LAB — BASIC METABOLIC PANEL
Anion gap: 16 — ABNORMAL HIGH (ref 5–15)
BUN: 58 mg/dL — ABNORMAL HIGH (ref 6–20)
CALCIUM: 8.5 mg/dL — AB (ref 8.9–10.3)
CO2: 24 mmol/L (ref 22–32)
CREATININE: 2.66 mg/dL — AB (ref 0.61–1.24)
Chloride: 102 mmol/L (ref 101–111)
GFR calc Af Amer: 23 mL/min — ABNORMAL LOW (ref >60)
GFR calc non Af Amer: 19 mL/min — ABNORMAL LOW (ref >60)
GLUCOSE: 178 mg/dL — AB (ref 65–99)
Potassium: 4.1 mmol/L (ref 3.5–5.1)
Sodium: 142 mmol/L (ref 135–145)

## 2017-11-15 LAB — BLOOD GAS, ARTERIAL
ACID-BASE EXCESS: 0.2 mmol/L (ref 0.0–2.0)
BICARBONATE: 23.4 mmol/L (ref 20.0–28.0)
DRAWN BY: 24686
O2 Content: 8 L/min
O2 Saturation: 90.1 %
PATIENT TEMPERATURE: 98.6
pCO2 arterial: 32.2 mmHg (ref 32.0–48.0)
pH, Arterial: 7.475 — ABNORMAL HIGH (ref 7.350–7.450)
pO2, Arterial: 57 mmHg — ABNORMAL LOW (ref 83.0–108.0)

## 2017-11-15 LAB — MAGNESIUM: MAGNESIUM: 2.1 mg/dL (ref 1.7–2.4)

## 2017-11-15 MED ORDER — LORAZEPAM 2 MG/ML IJ SOLN
1.0000 mg | INTRAMUSCULAR | Status: DC
  Administered 2017-11-15: 1 mg via INTRAVENOUS
  Filled 2017-11-15: qty 1

## 2017-11-15 MED ORDER — SODIUM CHLORIDE 0.9 % IV BOLUS
500.0000 mL | Freq: Once | INTRAVENOUS | Status: AC
  Administered 2017-11-15: 500 mL via INTRAVENOUS

## 2017-11-15 MED ORDER — LEVALBUTEROL HCL 1.25 MG/0.5ML IN NEBU
1.2500 mg | INHALATION_SOLUTION | Freq: Once | RESPIRATORY_TRACT | Status: AC
  Administered 2017-11-15: 1.25 mg via RESPIRATORY_TRACT
  Filled 2017-11-15: qty 0.5

## 2017-11-15 MED ORDER — IPRATROPIUM BROMIDE 0.02 % IN SOLN: 0.5000 mg | Freq: Once | RESPIRATORY_TRACT | Status: DC

## 2017-11-15 MED ORDER — ALBUTEROL SULFATE (2.5 MG/3ML) 0.083% IN NEBU: 5.0000 mg | INHALATION_SOLUTION | Freq: Once | RESPIRATORY_TRACT | Status: DC

## 2017-11-15 MED ORDER — PIPERACILLIN-TAZOBACTAM IN DEX 2-0.25 GM/50ML IV SOLN
2.2500 g | Freq: Four times a day (QID) | INTRAVENOUS | Status: DC
  Administered 2017-11-15: 2.25 g via INTRAVENOUS
  Filled 2017-11-15 (×3): qty 50

## 2017-11-15 MED ORDER — MORPHINE SULFATE (PF) 4 MG/ML IV SOLN: 1.0000 mg | INTRAVENOUS | Status: DC | PRN

## 2017-11-15 NOTE — Progress Notes (Signed)
Page to Dr Janee Mornhompson  3e28 Clydene PughWoodard. pt on high flow at 10 liters, refuses in/out cath. No urine output for labs. zosyn hanging. palliative at 4p confirmed.

## 2017-11-15 NOTE — Progress Notes (Addendum)
Pt is not expelling sputum. Will encourage use of urinal; if not able with in/out cath patient for urine specimen. Education on flutter valve (ordered by MD this morning) unable to be performed due to pt's cognition.

## 2017-11-15 NOTE — Progress Notes (Signed)
Pt now on 6L per [facility] with O2 sats bet 93-96%. Pt is calm and comfortable now. Will endorsed it to the morning RN.

## 2017-11-15 NOTE — Progress Notes (Signed)
Pt found trying to get out of bed, with nasal canula around his eyes. Nasal canula replaced to pt's nose. Moisture added to 6L of O2 pt is/has been receiving. Replcaed pulse oximeter to pt's hand, which revealed O2 saturation of 78-80 (prior to replacing the tubing to correct location).  Pt continued on nasal canula at 6L with moisture via nasal canula, at which point he does not rise above 85%, when pt talks his saturation does drop to 80% or below.

## 2017-11-15 NOTE — Progress Notes (Addendum)
Comfort cart requested for family. IV fluids adjusted. Pulse oximeter removed. Family at bedside. Room tidied. Blankets straightened.

## 2017-11-15 NOTE — Progress Notes (Signed)
Pt was hypoxic this am ,O2 sat in the 70's on room air, lungs sound clear, pt denies feeling short of breath. Pt placed on 6L per [place] with O2 sats went up to 90 but easily drops to 80's, nonrebreather mask was placed with 100% o2 sats. Rapid response team at the bedside. MD updated and ordered for a breathing treatment. Pt was very appreciative for all the help he is getting, pt stated " The good Shaune PollackLord has been good to me for the past 91 years of my life".  Will continue to monitor pt.

## 2017-11-15 NOTE — Progress Notes (Signed)
Pt pulled out the NGT 2x. Pt started to get agitated, pt stated " I don't want it!". MD updated and verbalized to just let the pt rest tonight and reconsider of placing the NGT this am. Pt denies abdominal pain. Will continue to monitor pt.

## 2017-11-15 NOTE — Progress Notes (Signed)
PROGRESS NOTE    Benjamin Petersen  ZOX:[id number] DOB: [birth year] DOA: 11-19-17 PCP: Burton Apley, MD   Brief Narrative:  Patient is a 84 year old gentleman history of hypertension, coronary artery disease status post CABG 1992 presented to the ED with chest pain epigastric abdominal pain ongoing since the morning of admission.  Pain located in the frontal lower chest and in the epigastric region intermittent moderate sharp nonradiating.  Per ED physician patient also with complaints of some mild shortness of breath however denies shortness of breath admitting physician.  Patient given aspirin nitroglycerin patch remained chest pain-free.  Patient also with cough with clear mucus production.  Patient with no signs or symptoms of UTI.  The abdomen and pelvis done concerning for left inguinal hernia with small bowel obstruction.  Patient seen in consultation by general surgery however patient is refusing any surgical intervention at this time.  She is also refused manual reduction as he had stated to general surgeon that this was tried before. Patient improved clinically was started on a diet and advance to a soft diet which he initially tolerated.  Patient was supposed to be discharged however patient started having nausea and multiple bouts of emesis.  Hernia noted in the left inguinal region.  KUB ordered was consistent with mechanical small bowel obstruction.  Patient did have some abdominal distention.  NG tube was placed however patient pulled out NG tube twice stating he did not want it.  Patient was still insistent on not having any surgery done.  And also noted on 11/14/2017 at night to have increased O2 requirements.  Palliative care was consulted and are following.   Assessment & Plan:   Principal Problem:   SBO (small bowel obstruction) (HCC) Active Problems:   Left inguinal hernia   Acute respiratory distress   Chest pain   Essential hypertension   Leukocytosis   Epigastric  abdominal pain   Goals of care, counseling/discussion   Palliative care by specialist   Small bowel obstruction (HCC)   Vomiting   ARF (acute renal failure) (HCC)   Aspiration pneumonia due to gastric secretions (HCC)  #1 left inguinal hernia with small bowel obstruction Patient has been seen in consultation by general surgery and patient refusing any surgical intervention at time to reduce the hernia.  General surgery explained to patient and family that if not repaired bowel obstruction could worsen leading to death.  Patient assessed by general surgery again today patient still not interested in any surgical intervention at this time.  Inguinal hernia reduced.  Per nurse tech patient noted to have bowel movement the morning of 11/12/2017.  Patient's diet was advanced to a soft diet which he initially tolerated and subsequently to being discharged patient had some nausea and emesis with some abdominal distention.  Repeat abdominal films done consistent with mechanical small bowel obstruction.  Patient currently n.p.o.  Patient with some emesis and nausea over the past 24 hours.  Patient now with increasing O2 requirements..  Patient still insistent on not having any surgery done.  NG tube was ordered and placed however patient pulled out NG tube twice per RN.  Will leave NG tube out for now.  Continue gentle hydration.  Antiemetics, supportive care.  Palliative care following.  2.  Acute respiratory distress likely secondary to probable aspiration pneumonia Patient hypoxic overnight with increased O2 requirements.  Patient with multiple episodes of nausea and emesis secondary to mechanical small bowel obstruction secondary to left inguinal hernia.  Patient refusing surgery at  this time.  Patient significantly dehydrated and as such chest x-ray with no obvious infiltrate at this time.  Check a ABG.  Due to patient's increased O2 requirements will place empirically on IV Zosyn for probable aspiration  pneumonia.  3.  Acute renal failure Likely secondary to prerenal azotemia.  Patient with episodes of nausea and emesis over the past 24 hours secondary to mechanical small bowel obstruction.  Creatinine currently at 2.66 from 1.11.  BUN elevated at 58.  Check a fractional excretion of sodium.  Repeat UA with cultures and sensitivities.  Given normal saline 500 cc bolus x1 now.  Increase IV fluids to normal saline at 100 cc/h.  Follow.  If continued worsening and no improvement may consider checking a renal ultrasound.  Follow for now.  4.  Hypertension Blood pressure stable.  Discontinued IV enalapril.  Discontinued Cozaar.  Patient with emesis and likely dehydration.  Patient with small bowel obstruction again and currently n.p.o.  Gentle hydration.  Follow.   5.  Leukocytosis Likely a reactive leukocytosis versus secondary to aspiration as patient had episodes of emesis yesterday.  WBC fluctuating.  Patient currently afebrile.  Urinalysis unremarkable.  Chest x-ray with no obvious infiltrate noted.  Patient however with increased O2 requirements and as such we will place empirically on IV Zosyn for probable aspiration pneumonia.  6.  Chest pain Likely referred pain secondary to problem #1.  Patient with no overt chest pain at this time.  Patient with prior history of coronary artery disease status post CABG.  Cardiac enzymes negative x3.  2D echo with EF of 55-60%, no wall motion abnormalities.  No further workup needed at this time.  Discontinued IV enalapril.  Discontinued Cozaar.  7.  Hypokalemia Repleted.   DVT prophylaxis: SCDs Code Status: DNR Family Communication: Updated patient.  No family at bedside. Disposition Plan: Likely home with home health if improves and small bowel obstruction resolved versus residential hospice/comfort measures.    Consultants:   General surgery: Dr. Luisa Hart 11/11/2017  Palliative care Eduard Roux, NP/Dr. Linna Darner 11/12/2017    Procedures:   Chest  x-ray 11/18/2017  CT abdomen and pelvis 11/11/2017  2D echo 11/11/2017  Abdominal films 11/13/2017  Antimicrobials:   IV Zosyn 11/15/2017   Subjective: Hernia noted on exam.  Patient noted overnight to be hypoxic and had to be placed on 6 L O2.  Patient pulled out NG tube twice and refusing it.  Patient denies significant abdominal pain.  No bowel movement.  Feels hernia has returned.   Objective: Vitals:   11/15/17 0528 11/15/17 0550 11/15/17 0851 11/15/17 0945  BP:   (!) 93/51 (!) 117/39  Pulse:  (!) 110 (!) 101 (!) 104  Resp:  20 16   Temp:   97.8 F (36.6 C) 98.3 F (36.8 C)  TempSrc:   Oral Oral  SpO2: 94% 96% 91% 90%  Weight:      Height:        Intake/Output Summary (Last 24 hours) at 11/15/2017 1214 Last data filed at 11/15/2017 0900 Gross per 24 hour  Intake 1441.25 ml  Output 950 ml  Net 491.25 ml   Filed Weights   11/13/17 0600 11/14/17 0505 11/15/17 0500  Weight: 63.3 kg (139 lb 8 oz) 62.2 kg (137 lb 2 oz) 60.8 kg (134 lb)    Examination:  General exam: NAD Respiratory system: Lungs clear to auscultation anterior lung fields.  No crackles noted.  No wheezing noted.  No rhonchi noted.  Patient on  nasal cannula 6 L.   Cardiovascular system: Regular rate and rhythm no murmurs rubs or gallops.  No pedal edema. Gastrointestinal system: Abdomen is some tenderness to palpation in the lower quadrants, soft, mildly distended, inguinal hernia, no rebound, no guarding.  Genitourinary: Left inguinal hernia noted. Central nervous system: Alert. No focal neurological deficits. Extremities: Symmetric 5 x 5 power. Skin: No rashes, lesions or ulcers Psychiatry: Judgement and insight appear fair. Mood & affect appropriate.     Data Reviewed: I have personally reviewed following labs and imaging studies  CBC: Recent Labs  Lab 11/05/2017 2150 11/11/17 1101 11/12/17 0738 11/14/17 0529 11/15/17 0607  WBC 13.3* 14.3* 9.5 12.3* 10.1  NEUTROABS  --  13.0*  --   --   --     HGB 11.5* 11.3* 10.3* 13.4 11.9*  HCT 35.7* 35.0* 31.2* 39.8 36.2*  MCV 97.0 95.9 96.9 93.2 94.3  PLT 225 208 183 236 235   Basic Metabolic Panel: Recent Labs  Lab 10/30/2017 2150 11/11/17 1101 11/12/17 0738 11/14/17 0529 11/15/17 0607  NA 139 138 138 139 142  K 3.9 4.1 3.3* 4.0 4.1  CL 98* 103 102 101 102  CO2 27 23 27 25 24   GLUCOSE 191* 132* 93 179* 178*  BUN 28* 24* 24* 27* 58*  CREATININE 1.17 1.01 1.02 1.11 2.66*  CALCIUM 9.9 9.2 8.6* 8.7* 8.5*  MG  --  1.9 1.9  --  2.1   GFR: Estimated Creatinine Clearance: 15.6 mL/min (A) (by C-G formula based on SCr of 2.66 mg/dL (H)). Liver Function Tests: Recent Labs  Lab 11/11/17 0010 11/11/17 1101  AST 29 32  ALT 20 21  ALKPHOS 68 74  BILITOT 0.7 0.6  PROT 6.6 6.5  ALBUMIN 3.8 3.8   Recent Labs  Lab 11/11/17 0010  LIPASE 26   No results for input(s): AMMONIA in the last 168 hours. Coagulation Profile: Recent Labs  Lab 11/11/17 0121  INR 1.04   Cardiac Enzymes: Recent Labs  Lab 11/11/17 0010 11/11/17 0442 11/11/17 1101  TROPONINI <0.03 <0.03 0.04*   BNP (last 3 results) No results for input(s): PROBNP in the last 8760 hours. HbA1C: No results for input(s): HGBA1C in the last 72 hours. CBG: Recent Labs  Lab 11/13/17 2011  GLUCAP 151*   Lipid Profile: No results for input(s): CHOL, HDL, LDLCALC, TRIG, CHOLHDL, LDLDIRECT in the last 72 hours. Thyroid Function Tests: No results for input(s): TSH, T4TOTAL, FREET4, T3FREE, THYROIDAB in the last 72 hours. Anemia Panel: No results for input(s): VITAMINB12, FOLATE, FERRITIN, TIBC, IRON, RETICCTPCT in the last 72 hours. Sepsis Labs: Recent Labs  Lab 11/11/17 0442 11/11/17 0616  PROCALCITON <0.10  --   LATICACIDVEN 2.1* 2.3*    Recent Results (from the past 240 hour(s))  Culture, blood (Routine X 2) w Reflex to ID Panel     Status: None (Preliminary result)   Collection Time: 11/11/17 12:10 AM  Result Value Ref Range Status   Specimen Description  BLOOD RIGHT ANTECUBITAL  Final   Special Requests   Final    BOTTLES DRAWN AEROBIC AND ANAEROBIC Blood Culture adequate volume   Culture   Final    NO GROWTH 4 DAYS Performed at [facility], [address] [place], [place] [address]    Report Status PENDING  Incomplete  Culture, blood (Routine X 2) w Reflex to ID Panel     Status: None (Preliminary result)   Collection Time: 11/11/17 12:25 AM  Result Value Ref Range  Status   Specimen Description BLOOD LEFT ANTECUBITAL  Final   Special Requests   Final    BOTTLES DRAWN AEROBIC AND ANAEROBIC Blood Culture adequate volume   Culture   Final    NO GROWTH 4 DAYS Performed at [facility], [address], [place], [place] [address]    Report Status PENDING  Incomplete  Rapid Strep Screen (Not at [facility])     Status: None   Collection Time: 11/11/17 12:50 AM  Result Value Ref Range Status   Streptococcus, Group A Screen (Direct) NEGATIVE NEGATIVE Final    Comment: (NOTE) A Rapid Antigen test may result negative if the antigen level in the sample is below the detection level of this test. The FDA has not cleared this test as a stand-alone test therefore the rapid antigen negative result has reflexed to a Group A Strep culture. Performed at [facility], [address] [place], [place] [address]   Culture, group A strep     Status: None   Collection Time: 11/11/17 12:50 AM  Result Value Ref Range Status   Specimen Description THROAT  Final   Special Requests NONE Reflexed from [id number]  Final   Culture   Final    NO GROUP A STREP (S.PYOGENES) ISOLATED Performed at [facility], [address], [place], [place] [address]    Report Status 11/13/2017 FINAL  Final         Radiology Studies: Dg Chest 2 View  Result Date: 11/14/2017 CLINICAL DATA:  Vomiting. History of coronary artery disease and CABG. Nonsmoker EXAM: CHEST - 2 VIEW COMPARISON:  PA and lateral chest x-ray of November 10, 2017 FINDINGS: The lungs  are adequately inflated. There is hazy increased density in the left lower lobe. There is blunting of the posterior costophrenic angles. The heart and pulmonary vascularity are normal. There are post median sternotomy changes. There is calcification in the wall of the thoracic aorta. There is prominent thoracic kyphosis. IMPRESSION: New small bilateral pleural effusions layering posteriorly. Subtle increased lung markings at the left base worrisome for atelectasis or pneumonia. Followup PA and lateral chest X-ray is recommended in 3-4 weeks following trial of antibiotic therapy to ensure resolution and exclude underlying malignancy. Previous CABG.  Thoracic aortic atherosclerosis. Electronically Signed   By: David  [place] M.D.   On: 11/14/2017 10:07   Dg Abd 1 View  Result Date: 11/15/2017 CLINICAL DATA:  Abdominal pain and distention EXAM: ABDOMEN - 1 VIEW COMPARISON:  November 14, 2017 FINDINGS: There is generalized bowel dilatation. No free air evident. Postoperative changes are noted in the pelvis. IMPRESSION: Suspect generalized ileus, although a degree of distal bowel obstruction is a differential consideration. No free air evident. Appearance is similar to 1 day prior. Electronically Signed   By: Bretta Bang III M.D.   On: 11/15/2017 09:51   Dg Abd 1 View  Result Date: 11/14/2017 CLINICAL DATA:  Nasogastric tube placement. EXAM: ABDOMEN - 1 VIEW COMPARISON:  Radiographs November 13, 2016. FINDINGS: Mild small bowel dilatation is noted concerning for ileus or obstruction. Stool is noted in the left colon. Distal tip of nasogastric tube is not clearly visualized. IMPRESSION: Nasogastric tube not clearly visualized. Mild small bowel dilatation is noted concerning for ileus or obstruction. Electronically Signed   By: Lupita Raider, M.D.   On: 11/14/2017 21:10   Dg Chest Port 1 View  Result Date: 11/15/2017 CLINICAL DATA:  Hypoxia EXAM: PORTABLE CHEST 1 VIEW COMPARISON:  November 14, 2017 FINDINGS:  There are small pleural effusions bilaterally with bibasilar atelectasis. No consolidation. There is an azygos lobe on the right, an anatomic variant. Heart is upper normal in size with pulmonary vascularity within normal limits. No adenopathy. There is aortic atherosclerosis. Areas of postoperative change with prior median sternotomy and coronary artery bypass grafting. IMPRESSION: Small pleural effusions and bibasilar atelectasis. Stable cardiac silhouette. Aortic atherosclerosis noted. Aortic Atherosclerosis (ICD10-I70.0). Electronically Signed   By: Bretta Bang III M.D.   On: 11/15/2017 09:52   Dg Abd 2 Views  Result Date: 11/14/2017 CLINICAL DATA:  Emesis EXAM: ABDOMEN - 2 VIEW COMPARISON:  CT 10/22/2017 FINDINGS: Atelectasis at the lung bases. Sternotomy changes. Multiple dilated loops of small bowel, measuring up to 3.6 cm consistent with a bowel obstruction. Postsurgical changes in the left pelvis/groin. Lucency in the left upper quadrant and across the midline presumably represents gas-filled stomach. : Multiple dilated loops of small bowel, consistent with continued mechanical bowel obstruction. Lucency in the left upper quadrant and across the upper abdomen, probably due to air distended stomach, however could attempt decubitus view or repeat CT if viscus perforation is suspected. Electronically Signed   By: Jasmine Pang M.D.   On: 11/14/2017 00:02   Dg Abd Portable 1v  Result Date: 11/15/2017 CLINICAL DATA:  84 y/o  M; NG tube placement. EXAM: PORTABLE ABDOMEN - 1 VIEW COMPARISON:  11/14/2017 abdomen radiograph FINDINGS: Enteric tube is coiling projecting over the lower chest, repositioning recommended. Stable partially visualized small bowel dilatation which may represent ileus or obstruction. IMPRESSION: Enteric tube is coiling over the lower chest, repositioning recommended. These results will be called to the ordering clinician or representative by the Radiologist Assistant, and  communication documented in the PACS or zVision Dashboard. Electronically Signed   By: Mitzi Hansen M.D.   On: 11/15/2017 00:30        Scheduled Meds: . multivitamin with minerals  1 tablet Oral Daily  . pantoprazole (PROTONIX) IV  40 mg Intravenous Q24H   Continuous Infusions: . sodium chloride 100 mL/hr at 11/15/17 0938     LOS: 2 days    Time spent: 40 minutes    Ramiro Harvest, MD [facility] Pager [phone]  If 7PM-7AM, please contact night-coverage [email or web address] Password [facility] 11/15/2017, 12:14 PM

## 2017-11-15 NOTE — Plan of Care (Signed)
Pts condition shows little change per report.

## 2017-11-15 NOTE — Progress Notes (Signed)
Pt verbalized need to urinate. Provided urinal and running water to assist in initiating stream.   Pt currently with oxygen saturation of 99% on 8 Liters with moisture, via nasal canula; at rest.   Zosyn is not yet available to start.

## 2017-11-15 NOTE — Progress Notes (Signed)
Pt states that he has no complaints right now, pt keeps talking about his hearing aids. Cannot locate hearing aids in pts room; however patient now believes that he is at home- providing his home address when asked where we are.

## 2017-11-15 NOTE — Progress Notes (Signed)
Patient ID: Benjamin Petersen, male   DOB: May 25, 1924, 84 y.o.   MRN: [id number]  This NP meet family  at the bedside as scheduled for continued conversation regarding diagnosis, prognosis, goals of care and end-of-life wishes.  Patient has continued to decline over the last 12 hours.  He is less responsive and requiring higher oxygen support.    Patient continued to express his wish for a comfort path and no desire for any surgical interventions or aggressive measures to prolong life.  Both his wife and son who are at the bedside verbalize an understanding that this is an end-of-life situation for Benjamin Petersen.  They wish to support his wishes of comfort at this known end of life time and their main focus is for comfort and dignity.  We discussed the natural trajectory and expectations at end of life.  I shared with them both that anything could happen at any time and likely prognosis was hours to days.  Plan of Care -DNR/DNI -Comfort and dignity are the priority of care -No further diagnostics -No escalation of care, specifically oxygen level -DC cardiac telemetry -Symptom management to enhance comfort -Reevaluate in the morning for possible transition to residential hospice  Emotional support offered, family declined chaplain support.  Questions and concerns addressed   Discussed with Dr Janee Mornhompson    Total time spent on the unit was 35 minutes  Greater than 50% of the time was spent in counseling and coordination of care  Lorinda CreedMary Larach NP  Palliative Medicine Team Team Phone # [phone] Pager [phone]

## 2017-11-15 NOTE — Progress Notes (Signed)
PT Cancellation Note  Patient Details Name: Benjamin Petersen MRN: [id number] DOB: 12-09-1923   Cancelled Treatment:    Reason Eval/Treat Not Completed: (P) Medical issues which prohibited therapy(Pt with decline in medical presentation at this time will defer tx.  )   Benjamin Petersen 11/15/2017, 1:43 PM  Benjamin Petersen, PTA pager [phone]

## 2017-11-15 NOTE — Progress Notes (Signed)
Pharmacy Antibiotic Note  Benjamin Petersen is a [age] y.o. male admitted on 12-30-17 with presented to the ED with chest pain epigastric abdominal pain.  He improved clinically was started on a diet and advance to a soft diet which he initially tolerated. Patient was supposed to be discharged yesterday 11/14/17, however patient started having nausea and multiple bouts of emesis. Hernia noted in the left inguinal region.  KUB was consistent with mechanical small bowel obstruction. Patient insistent on not having surgery.  Acute respiratory distress likely 2/2 to probable aspiration pneumonia.   Pharmacy has been consulted for Zosyn dosing for possible aspiration pnemonia.  WBC wnl, has fluctuated.  Afebrile. SCr increased today to 2.66 , from 1.11 yesterday. UOP 650 ml on 3/25, 0.4 ml/kg/hr.  IVFs increased to NS 100cc/hr   Plan:  Zosyn 2.25 gm IV q6h  Monitor clinical status including renal function, culture results   Height: 6\' 1"  (185.4 cm) Weight: 134 lb (60.8 kg) IBW/kg (Calculated) : 79.9  Temp (24hrs), Avg:97.9 F (36.6 C), Min:97.5 F (36.4 C), Max:98.3 F (36.8 C)  Recent Labs  Lab 12-24-2017 2150 11/11/17 0442 11/11/17 0616 11/11/17 1101 11/12/17 0738 11/14/17 0529 11/15/17 0607  WBC 13.3*  --   --  14.3* 9.5 12.3* 10.1  CREATININE 1.17  --   --  1.01 1.02 1.11 2.66*  LATICACIDVEN  --  2.1* 2.3*  --   --   --   --     Estimated Creatinine Clearance: 15.6 mL/min (A) (by C-G formula based on SCr of 2.66 mg/dL (H)).    No Known Allergies  Antimicrobials this admission: Zosyn 3/27>>  Dose adjustments this admission: n/a  Microbiology results: 3/23 BCx x2: ngtd x4 days UCx: sent 3/23 throat :no group A strep/negative/Final 3/27 Sputum Cx: sent   Thank you for allowing pharmacy to be a part of this patient's care. Noah Delaineuth Mackenna Kamer, RPh Clinical Pharmacist Pager: [phone] Main Pharmacy [phone] 11/15/2017 12:35 PM

## 2017-11-16 LAB — CULTURE, BLOOD (ROUTINE X 2)
Culture: NO GROWTH
Culture: NO GROWTH
Special Requests: ADEQUATE
Special Requests: ADEQUATE

## 2017-11-16 MED ORDER — MORPHINE SULFATE (CONCENTRATE) 10 MG/0.5ML PO SOLN
5.0000 mg | Freq: Four times a day (QID) | ORAL | Status: DC
  Administered 2017-11-16 – 2017-11-17 (×3): 5 mg via ORAL
  Filled 2017-11-16 (×3): qty 0.5

## 2017-11-16 MED ORDER — LORAZEPAM 2 MG/ML IJ SOLN: 1.0000 mg | INTRAMUSCULAR | Status: DC | PRN

## 2017-11-16 MED ORDER — MORPHINE SULFATE (CONCENTRATE) 10 MG/0.5ML PO SOLN
5.0000 mg | ORAL | Status: DC | PRN
  Administered 2017-11-17: 5 mg via ORAL
  Filled 2017-11-16: qty 0.5

## 2017-11-16 NOTE — Progress Notes (Signed)
PROGRESS NOTE    Benjamin Petersen  ZOX:[id number] DOB: Jul 25, 1924 DOA: 10/20/2017 PCP: Burton Apley, MD   Brief Narrative:  Patient is a 84 year old gentleman history of hypertension, coronary artery disease status post CABG 1992 presented to the ED with chest pain epigastric abdominal pain ongoing since the morning of admission.  Pain located in the frontal lower chest and in the epigastric region intermittent moderate sharp nonradiating.  Per ED physician patient also with complaints of some mild shortness of breath however denies shortness of breath admitting physician.  Patient given aspirin nitroglycerin patch remained chest pain-free.  Patient also with cough with clear mucus production.  Patient with no signs or symptoms of UTI.  The abdomen and pelvis done concerning for left inguinal hernia with small bowel obstruction.  Patient seen in consultation by general surgery however patient is refusing any surgical intervention at this time.  She is also refused manual reduction as he had stated to general surgeon that this was tried before. Patient improved clinically was started on a diet and advance to a soft diet which he initially tolerated.  Patient was supposed to be discharged however patient started having nausea and multiple bouts of emesis.  Hernia noted in the left inguinal region.  KUB ordered was consistent with mechanical small bowel obstruction.  Patient did have some abdominal distention.  NG tube was placed however patient pulled out NG tube twice stating he did not want it.  Patient was still insistent on not having any surgery done.  And also noted on 11/14/2017 at night to have increased O2 requirements.  Palliative care was consulted and are following.  11/16/17: Patient seen and examined at his bedside.  He appears comfortable however, he has shallow breathing. Currently on Ativan and morphine for comfort measures only.   Assessment & Plan:   Principal Problem:   SBO (small  bowel obstruction) (HCC) Active Problems:   Chest pain   Essential hypertension   Leukocytosis   Epigastric abdominal pain   Left inguinal hernia   Goals of care, counseling/discussion   Palliative care by specialist   Small bowel obstruction (HCC)   Vomiting   ARF (acute renal failure) (HCC)   Acute respiratory distress   Aspiration pneumonia due to gastric secretions (HCC)   Hypoxia   Dyspnea  1. left inguinal hernia with small bowel obstruction 2.  Acute respiratory distress likely secondary to probable aspiration pneumonia 3.  Acute renal failure 4.  Hypertension 5.  Leukocytosis 6.  Chest pain 7.  Hypokalemia    DVT prophylaxis: SCDs Code Status: DNR/comfort measures only Family Communication: None at bedside   Disposition Plan: Patient is currently comfort measures only.  All attempts of treatment have been withheld.  Impending death or possible transition to residential hospice.   Consultants:   General surgery: Dr. Luisa Hart 11/11/2017  Palliative care Eduard Roux, NP/Dr. Linna Darner 11/12/2017  Procedures:   Chest x-ray 10/29/2017  CT abdomen and pelvis 11/11/2017  2D echo 11/11/2017  Abdominal films 11/13/2017  Antimicrobials:   IV Zosyn 11/15/2017   Objective: Vitals:   11/15/17 2032 11/15/17 2300 11/16/17 0440 11/16/17 0800  BP: (!) 125/46  (!) 113/43   Pulse: (!) 109     Resp: 18  20   Temp: 98.7 F (37.1 C)  98.5 F (36.9 C)   TempSrc: Oral  Oral   SpO2: 91% 92% 92% 91%  Weight:   62.1 kg (137 lb)   Height:        Intake/Output  Summary (Last 24 hours) at 11/16/2017 1152 Last data filed at 11/16/2017 0800 Gross per 24 hour  Intake 914.67 ml  Output 0 ml  Net 914.67 ml   Filed Weights   11/14/17 0505 11/15/17 0500 11/16/17 0440  Weight: 62.2 kg (137 lb 2 oz) 60.8 kg (134 lb) 62.1 kg (137 lb)    Examination:  General exam: 84 year old Caucasian male frail with shallow breath.  Not responsive to voices Respiratory system: Lungs are clear  to auscultation with no wheezes or crackles noted.   Cardiovascular system: Regular rate and rhythm with no rubs or gallops.  No lower extremity edema.  Gastrointestinal system: Soft nontender nondistended normal bowel sounds x4.   Left inguinal hernia.  Genitourinary: Left inguinal hernia noted. Central nervous system: Somnolent and not responsive to voices.   Extremities: No lower extremity edema.  Frail.   Skin: No obvious lesions or ulcers Psychiatry: Unable to assess due to somnolence.     Data Reviewed: I have personally reviewed following labs and imaging studies  CBC: Recent Labs  Lab 2017/12/07 2150 11/11/17 1101 11/12/17 0738 11/14/17 0529 11/15/17 0607  WBC 13.3* 14.3* 9.5 12.3* 10.1  NEUTROABS  --  13.0*  --   --   --   HGB 11.5* 11.3* 10.3* 13.4 11.9*  HCT 35.7* 35.0* 31.2* 39.8 36.2*  MCV 97.0 95.9 96.9 93.2 94.3  PLT 225 208 183 236 235   Basic Metabolic Panel: Recent Labs  Lab Dec 07, 2017 2150 11/11/17 1101 11/12/17 0738 11/14/17 0529 11/15/17 0607  NA 139 138 138 139 142  K 3.9 4.1 3.3* 4.0 4.1  CL 98* 103 102 101 102  CO2 27 23 27 25 24   GLUCOSE 191* 132* 93 179* 178*  BUN 28* 24* 24* 27* 58*  CREATININE 1.17 1.01 1.02 1.11 2.66*  CALCIUM 9.9 9.2 8.6* 8.7* 8.5*  MG  --  1.9 1.9  --  2.1   GFR: Estimated Creatinine Clearance: 15.9 mL/min (A) (by C-G formula based on SCr of 2.66 mg/dL (H)). Liver Function Tests: Recent Labs  Lab 11/11/17 0010 11/11/17 1101  AST 29 32  ALT 20 21  ALKPHOS 68 74  BILITOT 0.7 0.6  PROT 6.6 6.5  ALBUMIN 3.8 3.8   Recent Labs  Lab 11/11/17 0010  LIPASE 26   No results for input(s): AMMONIA in the last 168 hours. Coagulation Profile: Recent Labs  Lab 11/11/17 0121  INR 1.04   Cardiac Enzymes: Recent Labs  Lab 11/11/17 0010 11/11/17 0442 11/11/17 1101  TROPONINI <0.03 <0.03 0.04*   BNP (last 3 results) No results for input(s): PROBNP in the last 8760 hours. HbA1C: No results for input(s): HGBA1C in  the last 72 hours. CBG: Recent Labs  Lab 11/13/17 2011  GLUCAP 151*   Lipid Profile: No results for input(s): CHOL, HDL, LDLCALC, TRIG, CHOLHDL, LDLDIRECT in the last 72 hours. Thyroid Function Tests: No results for input(s): TSH, T4TOTAL, FREET4, T3FREE, THYROIDAB in the last 72 hours. Anemia Panel: No results for input(s): VITAMINB12, FOLATE, FERRITIN, TIBC, IRON, RETICCTPCT in the last 72 hours. Sepsis Labs: Recent Labs  Lab 11/11/17 0442 11/11/17 0616  PROCALCITON <0.10  --   LATICACIDVEN 2.1* 2.3*    Recent Results (from the past 240 hour(s))  Culture, blood (Routine X 2) w Reflex to ID Panel     Status: None   Collection Time: 11/11/17 12:10 AM  Result Value Ref Range Status   Specimen Description BLOOD RIGHT ANTECUBITAL  Final   Special Requests  Final    BOTTLES DRAWN AEROBIC AND ANAEROBIC Blood Culture adequate volume   Culture   Final    NO GROWTH 5 DAYS Performed at [facility], [address] [place], [place] [address]    Report Status 11/16/2017 FINAL  Final  Culture, blood (Routine X 2) w Reflex to ID Panel     Status: None   Collection Time: 11/11/17 12:25 AM  Result Value Ref Range Status   Specimen Description BLOOD LEFT ANTECUBITAL  Final   Special Requests   Final    BOTTLES DRAWN AEROBIC AND ANAEROBIC Blood Culture adequate volume   Culture   Final    NO GROWTH 5 DAYS Performed at [facility], [address] [place], [place] [address]    Report Status 11/16/2017 FINAL  Final  Rapid Strep Screen (Not at [facility])     Status: None   Collection Time: 11/11/17 12:50 AM  Result Value Ref Range Status   Streptococcus, Group A Screen (Direct) NEGATIVE NEGATIVE Final    Comment: (NOTE) A Rapid Antigen test may result negative if the antigen level in the sample is below the detection level of this test. The FDA has not cleared this test as a stand-alone test therefore the rapid antigen negative result has reflexed to a Group A Strep  culture. Performed at [facility], [address] [place], [place] [address]   Culture, group A strep     Status: None   Collection Time: 11/11/17 12:50 AM  Result Value Ref Range Status   Specimen Description THROAT  Final   Special Requests NONE Reflexed from [id number]  Final   Culture   Final    NO GROUP A STREP (S.PYOGENES) ISOLATED Performed at [facility], [address] [place], [place] [address]    Report Status 11/13/2017 FINAL  Final         Radiology Studies: Dg Abd 1 View  Result Date: 11/15/2017 CLINICAL DATA:  Abdominal pain and distention EXAM: ABDOMEN - 1 VIEW COMPARISON:  November 14, 2017 FINDINGS: There is generalized bowel dilatation. No free air evident. Postoperative changes are noted in the pelvis. IMPRESSION: Suspect generalized ileus, although a degree of distal bowel obstruction is a differential consideration. No free air evident. Appearance is similar to 1 day prior. Electronically Signed   By: Bretta BangWilliam  Woodruff III M.D.   On: 11/15/2017 09:51   Dg Abd 1 View  Result Date: 11/14/2017 CLINICAL DATA:  Nasogastric tube placement. EXAM: ABDOMEN - 1 VIEW COMPARISON:  Radiographs November 13, 2016. FINDINGS: Mild small bowel dilatation is noted concerning for ileus or obstruction. Stool is noted in the left colon. Distal tip of nasogastric tube is not clearly visualized. IMPRESSION: Nasogastric tube not clearly visualized. Mild small bowel dilatation is noted concerning for ileus or obstruction. Electronically Signed   By: Lupita RaiderJames  Green Jr, M.D.   On: 11/14/2017 21:10   Dg Chest Port 1 View  Result Date: 11/15/2017 CLINICAL DATA:  Hypoxia EXAM: PORTABLE CHEST 1 VIEW COMPARISON:  November 14, 2017 FINDINGS: There are small pleural effusions bilaterally with bibasilar atelectasis. No consolidation. There is an azygos lobe on the right, an anatomic variant. Heart is upper normal in size with pulmonary vascularity within normal limits. No adenopathy. There is  aortic atherosclerosis. Areas of postoperative change with prior median sternotomy and coronary artery bypass grafting. IMPRESSION: Small pleural effusions and bibasilar atelectasis. Stable cardiac silhouette. Aortic atherosclerosis noted. Aortic Atherosclerosis (ICD10-I70.0). Electronically Signed  By: Bretta Bang III M.D.   On: 11/15/2017 09:52   Dg Abd Portable 1v  Result Date: 11/15/2017 CLINICAL DATA:  84 y/o  M; NG tube placement. EXAM: PORTABLE ABDOMEN - 1 VIEW COMPARISON:  11/14/2017 abdomen radiograph FINDINGS: Enteric tube is coiling projecting over the lower chest, repositioning recommended. Stable partially visualized small bowel dilatation which may represent ileus or obstruction. IMPRESSION: Enteric tube is coiling over the lower chest, repositioning recommended. These results will be called to the ordering clinician or representative by the Radiologist Assistant, and communication documented in the PACS or zVision Dashboard. Electronically Signed   By: Mitzi Hansen M.D.   On: 11/15/2017 00:30        Scheduled Meds: . LORazepam  1 mg Intravenous Q4H   Continuous Infusions: . sodium chloride 10 mL/hr at 11/15/17 1651     LOS: 3 days    Time spent: 40 minutes    Darlin Drop, MD [facility] Pager [phone]  If 7PM-7AM, please contact night-coverage [email or web address] Password [facility] 11/16/2017, 11:52 AM

## 2017-11-16 NOTE — Progress Notes (Signed)
Patient ID: Charlestine MassedJames Markov, male   DOB: 1924/02/04, 84 y.o.   MRN: [id number]  This NP meet family visited  at the bedside for evaluation of palliative medicien needs and emotional support.  No family at bedside.  Patient appears comfortable, is minimally responsive.  Focus of care remains comfort and dignity as patient transitions at end of life.  Plan of Care remains: -DNR/DNI -Comfort and dignity are the priority of care -No further diagnostics -No escalation of care, specifically oxygen level -DC cardiac telemetry -Symptom management to enhance comfort     -Schedule Roxanol 5 mg po/sl every 6 hrs   Attempted to place call to son without success to discuss residential hospice, was unable to leave message.  Discussed with bedside nursing        Total time spent on the unit was 20 minutes  Continue to evaluate patient for appropriate transitions of care.  Prognosis is likely hours to days.  Greater than 50% of the time was spent in counseling and coordination of care  Lorinda CreedMary Gladyse Corvin NP  Palliative Medicine Team Team Phone # [phone] Pager [phone]

## 2017-11-16 NOTE — Progress Notes (Signed)
Patient has been resting comfortably throughout the night. Only requested ativan one time, other than that, he shakes his head no in response to getting pain medicine or something to help him calm down and relax. Patient now on 5L O2 via nasal cannula and sats 91-94%. Patient had an incontinence episode x1 (not sure if urine was incorporated)- may need bladder scan and in and out catheter (patient refused in and out catheter during day).

## 2017-11-17 MED ORDER — MORPHINE SULFATE (PF) 2 MG/ML IV SOLN
1.0000 mg | INTRAVENOUS | Status: DC | PRN
  Administered 2017-11-17 – 2017-11-18 (×4): 1 mg via INTRAVENOUS
  Filled 2017-11-17 (×4): qty 1

## 2017-11-17 MED ORDER — MORPHINE SULFATE (CONCENTRATE) 10 MG/0.5ML PO SOLN: 5.0000 mg | ORAL | Status: DC

## 2017-11-17 MED ORDER — GLYCOPYRROLATE 0.2 MG/ML IJ SOLN
0.2000 mg | Freq: Four times a day (QID) | INTRAMUSCULAR | Status: DC
  Administered 2017-11-17 – 2017-11-19 (×8): 0.2 mg via INTRAVENOUS
  Filled 2017-11-17 (×9): qty 1

## 2017-11-17 MED ORDER — MORPHINE SULFATE (CONCENTRATE) 10 MG/0.5ML PO SOLN
10.0000 mg | ORAL | Status: DC
  Administered 2017-11-17 – 2017-11-18 (×4): 10 mg via ORAL
  Filled 2017-11-17 (×4): qty 0.5

## 2017-11-17 NOTE — Progress Notes (Signed)
Palliative Medicine RN Note: Followed up for daily symptom check. Patient is having labored breathing. Spoke with Dr Phillips OdorGolding and adjusted medications. Patient is now requiring IV medications for symptom control. Resp status is concerning for stability during transport. Plan for PMT to see him tomorrow to ensure continued comfort and evaluate for appropriateness for transport to inpatient hospice.   Margret ChanceMelanie G. Lucas Exline, RN, BSN, [facility] Palliative Medicine Team 11/17/2017 11:02 AM Office [phone]

## 2017-11-17 NOTE — Care Management Important Message (Signed)
Important Message  Patient Details  Name: Benjamin Petersen MRN: [id number] Date of Birth: 12-05-1923   Medicare Important Message Given:  Yes    Benjamin Petersen, Benjamin Embry Ellen, RN 11/17/2017, 2:52 PM

## 2017-11-17 NOTE — Progress Notes (Addendum)
PROGRESS NOTE    Benjamin Petersen  ZOX:[id number] DOB: 01-09-1924 DOA: 11-19-2017 PCP: Burton Apley, MD   Brief Narrative:  Patient is a 84 year old gentleman history of hypertension, coronary artery disease status post CABG 1992 presented to the ED with chest pain epigastric abdominal pain ongoing since the morning of admission.  Pain located in the frontal lower chest and in the epigastric region intermittent moderate sharp nonradiating.  Per ED physician patient also with complaints of some mild shortness of breath however denies shortness of breath admitting physician.  Patient given aspirin nitroglycerin patch remained chest pain-free.  Patient also with cough with clear mucus production.  Patient with no signs or symptoms of UTI.  The abdomen and pelvis done concerning for left inguinal hernia with small bowel obstruction.  Patient seen in consultation by general surgery however patient is refusing any surgical intervention at this time.  She is also refused manual reduction as he had stated to general surgeon that this was tried before. Patient improved clinically was started on a diet and advance to a soft diet which he initially tolerated.  Patient was supposed to be discharged however patient started having nausea and multiple bouts of emesis.  Hernia noted in the left inguinal region.  KUB ordered was consistent with mechanical small bowel obstruction.  Patient did have some abdominal distention.  NG tube was placed however patient pulled out NG tube twice stating he did not want it.  Patient was still insistent on not having any surgery done.  And also noted on 11/14/2017 at night to have increased O2 requirements.  Palliative care was consulted and are following.  11/16/17: Patient seen and examined at his bedside.  He appears comfortable however, he has shallow breathing. Currently on Ativan and morphine for comfort measures only.  11/17/2017: Patient seen and examined at his bedside.  He  has labored breathing.  Palliative care team adjusting medications.   Assessment & Plan:   Principal Problem:   SBO (small bowel obstruction) (HCC) Active Problems:   Chest pain   Essential hypertension   Leukocytosis   Epigastric abdominal pain   Left inguinal hernia   Goals of care, counseling/discussion   Palliative care by specialist   Small bowel obstruction (HCC)   Vomiting   ARF (acute renal failure) (HCC)   Acute respiratory distress   Aspiration pneumonia due to gastric secretions (HCC)   Hypoxia   Dyspnea  1. left inguinal hernia with small bowel obstruction 2.  Acute respiratory distress likely secondary to aspiration pneumonia 3.  Acute renal failure 4.  Hypertension 5.  Leukocytosis 6.  Chest pain 7.  Hypokalemia  Patient is DNR/comfort measures only.  All efforts of treatment have been discontinued.  Palliative care team is ensuring continued comfort.  DVT prophylaxis: SCDs Code Status: DNR/comfort measures only Family Communication: None at bedside   Disposition Plan: Patient is currently comfort measures only.  All attempts of treatment have been withheld.  Impending death or possible transition to residential hospice.   Consultants:   General surgery: Dr. Luisa Hart 11/11/2017  Palliative care Eduard Roux, NP/Dr. Linna Darner 11/12/2017  Procedures:   Chest x-ray November 19, 2017  CT abdomen and pelvis 11/11/2017  2D echo 11/11/2017  Abdominal films 11/13/2017  Antimicrobials:   IV Zosyn 11/15/2017   Objective: Vitals:   11/16/17 1917 11/17/17 0050 11/17/17 0706 11/17/17 0800  BP: (!) 117/52 (!) 136/57 (!) 141/51   Pulse: (!) 108 (!) 105 (!) 103   Resp: 20 16 16  Temp: 98.2 F (36.8 C) 97.9 F (36.6 C) 97.6 F (36.4 C)   TempSrc: Oral Axillary Oral   SpO2: 90% 91% (!) 89% 90%  Weight:      Height:        Intake/Output Summary (Last 24 hours) at 11/17/2017 1506 Last data filed at 11/17/2017 1052 Gross per 24 hour  Intake 375.67 ml  Output 350  ml  Net 25.67 ml   Filed Weights   11/14/17 0505 11/15/17 0500 11/16/17 0440  Weight: 62.2 kg (137 lb 2 oz) 60.8 kg (134 lb) 62.1 kg (137 lb)    Examination: 11/17/2017  General exam: 84 year old Caucasian male frail with labored breathing. Respiratory system: Lungs are clear to auscultation with no wheezes or rales.  No JVD or thyromegaly noted. Cardiovascular system: Regular rate and rhythm with no rubs or gallops.  No lower extremity edema.  Gastrointestinal system: Soft nontender nondistended normal bowel sounds x4.   Left inguinal hernia.  Genitourinary: Left inguinal hernia noted. Central nervous system: Somnolent and not responsive to voices.   Extremities: No lower extremity edema.  Frail.   Skin: No obvious lesions or ulcers Psychiatry: Unable to assess due to somnolence.     Data Reviewed: I have personally reviewed following labs and imaging studies  CBC: Recent Labs  Lab 11/14/2017 2150 11/11/17 1101 11/12/17 0738 11/14/17 0529 11/15/17 0607  WBC 13.3* 14.3* 9.5 12.3* 10.1  NEUTROABS  --  13.0*  --   --   --   HGB 11.5* 11.3* 10.3* 13.4 11.9*  HCT 35.7* 35.0* 31.2* 39.8 36.2*  MCV 97.0 95.9 96.9 93.2 94.3  PLT 225 208 183 236 235   Basic Metabolic Panel: Recent Labs  Lab 10/21/2017 2150 11/11/17 1101 11/12/17 0738 11/14/17 0529 11/15/17 0607  NA 139 138 138 139 142  K 3.9 4.1 3.3* 4.0 4.1  CL 98* 103 102 101 102  CO2 27 23 27 25 24   GLUCOSE 191* 132* 93 179* 178*  BUN 28* 24* 24* 27* 58*  CREATININE 1.17 1.01 1.02 1.11 2.66*  CALCIUM 9.9 9.2 8.6* 8.7* 8.5*  MG  --  1.9 1.9  --  2.1   GFR: Estimated Creatinine Clearance: 15.9 mL/min (A) (by C-G formula based on SCr of 2.66 mg/dL (H)). Liver Function Tests: Recent Labs  Lab 11/11/17 0010 11/11/17 1101  AST 29 32  ALT 20 21  ALKPHOS 68 74  BILITOT 0.7 0.6  PROT 6.6 6.5  ALBUMIN 3.8 3.8   Recent Labs  Lab 11/11/17 0010  LIPASE 26   No results for input(s): AMMONIA in the last 168  hours. Coagulation Profile: Recent Labs  Lab 11/11/17 0121  INR 1.04   Cardiac Enzymes: Recent Labs  Lab 11/11/17 0010 11/11/17 0442 11/11/17 1101  TROPONINI <0.03 <0.03 0.04*   BNP (last 3 results) No results for input(s): PROBNP in the last 8760 hours. HbA1C: No results for input(s): HGBA1C in the last 72 hours. CBG: Recent Labs  Lab 11/13/17 2011  GLUCAP 151*   Lipid Profile: No results for input(s): CHOL, HDL, LDLCALC, TRIG, CHOLHDL, LDLDIRECT in the last 72 hours. Thyroid Function Tests: No results for input(s): TSH, T4TOTAL, FREET4, T3FREE, THYROIDAB in the last 72 hours. Anemia Panel: No results for input(s): VITAMINB12, FOLATE, FERRITIN, TIBC, IRON, RETICCTPCT in the last 72 hours. Sepsis Labs: Recent Labs  Lab 11/11/17 0442 11/11/17 0616  PROCALCITON <0.10  --   LATICACIDVEN 2.1* 2.3*    Recent Results (from the past 240 hour(s))  Culture, blood (Routine X 2) w Reflex to ID Panel     Status: None   Collection Time: 11/11/17 12:10 AM  Result Value Ref Range Status   Specimen Description BLOOD RIGHT ANTECUBITAL  Final   Special Requests   Final    BOTTLES DRAWN AEROBIC AND ANAEROBIC Blood Culture adequate volume   Culture   Final    NO GROWTH 5 DAYS Performed at [facility], [address] [place], [place] [address]    Report Status 11/16/2017 FINAL  Final  Culture, blood (Routine X 2) w Reflex to ID Panel     Status: None   Collection Time: 11/11/17 12:25 AM  Result Value Ref Range Status   Specimen Description BLOOD LEFT ANTECUBITAL  Final   Special Requests   Final    BOTTLES DRAWN AEROBIC AND ANAEROBIC Blood Culture adequate volume   Culture   Final    NO GROWTH 5 DAYS Performed at [facility], [address], [place], [place] [address]    Report Status 11/16/2017 FINAL  Final  Rapid Strep Screen (Not at [facility])     Status: None   Collection Time: 11/11/17 12:50 AM  Result Value Ref Range Status   Streptococcus, Group A Screen  (Direct) NEGATIVE NEGATIVE Final    Comment: (NOTE) A Rapid Antigen test may result negative if the antigen level in the sample is below the detection level of this test. The FDA has not cleared this test as a stand-alone test therefore the rapid antigen negative result has reflexed to a Group A Strep culture. Performed at [facility], [address], [place], [place] [address]   Culture, group A strep     Status: None   Collection Time: 11/11/17 12:50 AM  Result Value Ref Range Status   Specimen Description THROAT  Final   Special Requests NONE Reflexed from [id number]  Final   Culture   Final    NO GROUP A STREP (S.PYOGENES) ISOLATED Performed at [facility], [address], [place], [place] [address]    Report Status 11/13/2017 FINAL  Final         Radiology Studies: No results found.      Scheduled Meds: . glycopyrrolate  0.2 mg Intravenous Q6H  . morphine CONCENTRATE  10 mg Oral Q4H   Continuous Infusions: . sodium chloride 10 mL/hr at 11/15/17 1651     LOS: 4 days    Time spent: 40 minutes    Darlin Drop, MD [facility] Pager [phone]  If 7PM-7AM, please contact night-coverage [email or web address] Password [facility] 11/17/2017, 3:06 PM

## 2017-11-18 MED ORDER — MORPHINE SULFATE (CONCENTRATE) 10 MG/0.5ML PO SOLN
10.0000 mg | ORAL | Status: DC
  Administered 2017-11-18 – 2017-11-23 (×29): 10 mg via SUBLINGUAL
  Filled 2017-11-18 (×28): qty 0.5

## 2017-11-18 MED ORDER — LORAZEPAM 1 MG PO TABS: 1.0000 mg | ORAL_TABLET | ORAL | Status: DC | PRN

## 2017-11-18 MED ORDER — GLYCOPYRROLATE 0.2 MG/ML IJ SOLN
0.2000 mg | INTRAMUSCULAR | Status: DC | PRN
  Filled 2017-11-18: qty 1

## 2017-11-18 MED ORDER — PROCHLORPERAZINE 25 MG RE SUPP
25.0000 mg | Freq: Two times a day (BID) | RECTAL | Status: DC | PRN
  Filled 2017-11-18: qty 1

## 2017-11-18 MED ORDER — MORPHINE SULFATE (PF) 2 MG/ML IV SOLN: 2.0000 mg | INTRAVENOUS | Status: DC | PRN

## 2017-11-18 MED ORDER — HALOPERIDOL LACTATE 5 MG/ML IJ SOLN: 0.5000 mg | INTRAMUSCULAR | Status: DC | PRN

## 2017-11-18 MED ORDER — PROCHLORPERAZINE MALEATE 10 MG PO TABS: 10.0000 mg | ORAL_TABLET | Freq: Four times a day (QID) | ORAL | Status: DC | PRN

## 2017-11-18 MED ORDER — PROCHLORPERAZINE EDISYLATE 5 MG/ML IJ SOLN
10.0000 mg | Freq: Two times a day (BID) | INTRAMUSCULAR | Status: DC | PRN
  Filled 2017-11-18: qty 2

## 2017-11-18 MED ORDER — HALOPERIDOL 0.5 MG PO TABS
0.5000 mg | ORAL_TABLET | ORAL | Status: DC | PRN
  Filled 2017-11-18: qty 1

## 2017-11-18 MED ORDER — LORAZEPAM 2 MG/ML IJ SOLN: 1.0000 mg | INTRAMUSCULAR | Status: DC | PRN

## 2017-11-18 MED ORDER — ACETAMINOPHEN 325 MG PO TABS: 650.0000 mg | ORAL_TABLET | Freq: Four times a day (QID) | ORAL | Status: DC | PRN

## 2017-11-18 MED ORDER — GLYCOPYRROLATE 1 MG PO TABS
1.0000 mg | ORAL_TABLET | ORAL | Status: DC | PRN
  Filled 2017-11-18: qty 1

## 2017-11-18 MED ORDER — ACETAMINOPHEN 650 MG RE SUPP: 650.0000 mg | Freq: Four times a day (QID) | RECTAL | Status: DC | PRN

## 2017-11-18 MED ORDER — LORAZEPAM 2 MG/ML PO CONC: 1.0000 mg | ORAL | Status: DC | PRN

## 2017-11-18 MED ORDER — HALOPERIDOL LACTATE 2 MG/ML PO CONC
0.5000 mg | ORAL | Status: DC | PRN
  Filled 2017-11-18: qty 0.3

## 2017-11-18 NOTE — Progress Notes (Signed)
RN rounded on pt. Pt's family states they do not need anything at this time.

## 2017-11-18 NOTE — Progress Notes (Signed)
[facility] Progress Note  Patient: Benjamin Petersen ZOX:[id number]   PCP: Burton Apley, MD DOB: Sep 10, 1923   DOA: 11/09/2017   DOS: 11/18/2017   Date of Service: the patient was seen and examined on 11/18/2017  Subjective: Patient breathing heavy, also heart rate elevated.  Brief hospital course: Pt. with PMH of HTN, CAD, CABG; admitted on 11/15/2017, presented with complaint of chest pain and abdominal pain, was found to have small bowel obstruction.  Initially treated conservatively with improvement.  Right before the discharge patient had reoccurrence of nausea and vomiting with recurrence of SBO and surgery recommended comfort measures for the patient.  Patient not interested in any surgical intervention.  Palliative care was consulted and patient's care will transition to complete comfort Currently further plan is continue comfort measures.  Assessment and Plan: 1.  Left inguinal hernia with small bowel obstruction. General surgery consulted. Initially the recommended surgical intervention but patient refused any intervention. Patient improved with conservative measures and was able to tolerate diet and also had a bowel movement. On 11/13/2017 patient started with vomiting again and x-ray showed SBO again. Family and the patient decided to transition patient's care to complete comfort due to high-grade obstruction.  2.  Acute respiratory failure. Aspiration pneumonia. Now comfort measures.  3.  Acute kidney injury. From dehydration. Now comfort measures.  4.  Tachycardia. Tachypnea. Continue comfort measures for now.  Continue scheduled morphine.  Diet: Comfort feed DVT Prophylaxis: Comfort care  Advance goals of care discussion: DNR/DNI, comfort care, anticipate in hospital death  Family Communication: no family was present at bedside, at the time of interview.   Disposition:  Discharge to be determined inpatient hospice versus in hospital death.  Consultants:  General surgery  Procedures: none  Antibiotics: Anti-infectives (From admission, onward)   Start     Dose/Rate Route Frequency Ordered Stop   11/15/17 1330  piperacillin-tazobactam (ZOSYN) IVPB 2.25 g  Status:  Discontinued     2.25 g 100 mL/hr over 30 Minutes Intravenous Every 6 hours 11/15/17 1251 11/15/17 1606       Objective: Physical Exam: Vitals:   11/17/17 0800 11/17/17 1958 11/17/17 2032 11/18/17 1334  BP:  (!) 159/61    Pulse:  (!) 120 (!) 130   Resp:  16  12  Temp:  98.9 F (37.2 C)    TempSrc:  Axillary    SpO2: 90% (!) 85%    Weight:      Height:       No intake or output data in the 24 hours ending 11/18/17 1511 Filed Weights   11/14/17 0505 11/15/17 0500 11/16/17 0440  Weight: 62.2 kg (137 lb 2 oz) 60.8 kg (134 lb) 62.1 kg (137 lb)   General: Drowsy and lethargic, not following any commands. ENT: Oral Mucosa clear moist Neck: difficult to assess  JVD, no Abnormal Mass Or lumps Cardiovascular: S1 and S2 Present, aortic systolic Murmur, Peripheral Pulses Present Respiratory: increased respiratory effort, Bilateral Air entry equal and Decreased, positive use of accessory muscle, basal Crackles, no wheezes Abdomen: Bowel Sound absent Skin: no redness, no Rash, no induration  Data Reviewed: CBC: Recent Labs  Lab 11/12/17 0738 11/14/17 0529 11/15/17 0607  WBC 9.5 12.3* 10.1  HGB 10.3* 13.4 11.9*  HCT 31.2* 39.8 36.2*  MCV 96.9 93.2 94.3  PLT 183 236 235   Basic Metabolic Panel: Recent Labs  Lab 11/12/17 0738 11/14/17 0529 11/15/17 0607  NA 138 139 142  K 3.3* 4.0 4.1  CL 102 101  102  CO2 27 25 24   GLUCOSE 93 179* 178*  BUN 24* 27* 58*  CREATININE 1.02 1.11 2.66*  CALCIUM 8.6* 8.7* 8.5*  MG 1.9  --  2.1    Liver Function Tests: No results for input(s): AST, ALT, ALKPHOS, BILITOT, PROT, ALBUMIN in the last 168 hours. No results for input(s): LIPASE, AMYLASE in the last 168 hours. No results for input(s): AMMONIA in the last 168  hours. Coagulation Profile: No results for input(s): INR, PROTIME in the last 168 hours. Cardiac Enzymes: No results for input(s): CKTOTAL, CKMB, CKMBINDEX, TROPONINI in the last 168 hours. BNP (last 3 results) No results for input(s): PROBNP in the last 8760 hours. CBG: Recent Labs  Lab 11/13/17 2011  GLUCAP 151*   Studies: No results found.  Scheduled Meds: . glycopyrrolate  0.2 mg Intravenous Q6H  . morphine CONCENTRATE  10 mg Sublingual Q4H   Continuous Infusions: . sodium chloride 10 mL/hr at 11/15/17 1651   PRN Meds: acetaminophen **OR** acetaminophen, glycopyrrolate **OR** glycopyrrolate **OR** glycopyrrolate, haloperidol **OR** haloperidol **OR** haloperidol lactate, LORazepam **OR** LORazepam **OR** LORazepam, LORazepam, morphine injection, ondansetron (ZOFRAN) IV, prochlorperazine **OR** prochlorperazine **OR** prochlorperazine  Time spent: 35 minutes  Author: Lynden OxfordPranav Shandi Godfrey, MD [facility] Pager: [phone] 11/18/2017 3:11 PM  If 7PM-7AM, please contact night-coverage at [email or web address], password [email or web address]

## 2017-11-18 NOTE — Progress Notes (Signed)
RN spoke with palliative RN. Pt's RR=12 now, but no other changes from palliative RN's assessment. RN will continue to round on pt.

## 2017-11-18 NOTE — Progress Notes (Signed)
Palliative Medicine RN Note: Patient is resting with eyes open. Secretions improved today. While RR is 16, he is working hard to breathe. RN held scheduled Roxinol because patient was sleeping. Will request she give IV morphine now, and per Dr Phillips OdorGolding, modified order to state "do not hold."   [facility]. At this time, patient remains fragile, and I don't believe he could tolerate transport to a facility farther away because of the length of the ambulance ride. Per Dr Phillips OdorGolding, prognosis is likely hours to a few days, and hospital death is anticipated.  Margret ChanceMelanie G. Tanzania Basham, RN, BSN, [facility] Palliative Medicine Team 11/18/2017 12:29 PM Office [phone]

## 2017-11-18 NOTE — Progress Notes (Signed)
Pt's wife and son at bedside

## 2017-11-19 MED ORDER — ATROPINE SULFATE 1 % OP SOLN: 4.0000 [drp] | Freq: Three times a day (TID) | OPHTHALMIC | Status: DC

## 2017-11-19 MED ORDER — ATROPINE SULFATE 1 % OP SOLN
4.0000 [drp] | Freq: Four times a day (QID) | OPHTHALMIC | Status: DC
  Administered 2017-11-19 – 2017-11-22 (×15): 4 [drp] via SUBLINGUAL
  Filled 2017-11-19 (×2): qty 2

## 2017-11-19 NOTE — Plan of Care (Signed)
Pt is comfort care, actively dying.

## 2017-11-19 NOTE — Progress Notes (Signed)
Patient ID: Benjamin Petersen, male   DOB: 1924/06/02, 84 y.o.   MRN: [id number]  This NP assessed the patient at the bedside for evaluation of palliative medicine needs and emotional support.  No family at bedside.    Patient appears comfortable, is minimally responsive.  He is having periods of apnea feet are cool and mottled.  Placed call to son/Benjamin Petersen  Focus of care remains comfort and dignity as patient transitions at end of life.  Plan of Care remains: -DNR/DNI -Comfort and dignity are the priority of care -No further diagnostics -No escalation of care, specifically oxygen level -DC cardiac telemetry -Symptom management to enhance comfort     -Schedule Roxanol 10 mg po/sl every 4 hrs      - PRN Robinul, Haldol, Tylenol, Ativan     Discussed with Dr Allena KatzPatel       Total time spent on the unit was 30 minutes, time in 0945 time out 1015   Prognosis is likely hours.  Expect hospital death not stable for transport.  Greater than 50% of the time was spent in counseling and coordination of care  Benjamin CreedMary Kesia Dalto NP  Palliative Medicine Team Team Phone # [phone] Pager [phone]

## 2017-11-19 NOTE — Progress Notes (Signed)
IV access discontinued, site leaking. Pt resting comfortable. Gown changed.

## 2017-11-19 NOTE — Progress Notes (Signed)
[facility] Progress Note  Patient: Benjamin Petersen ZOX:[id number]   PCP: Burton Apley, MD DOB: [birth year]   DOA: 2017-12-10   DOS: 11/19/2017   Date of Service: the patient was seen and examined on 11/19/2017  Subjective: Breathing is better.  Lost IV access.  Brief hospital course: Pt. with PMH of HTN, CAD, CABG; admitted on Dec 10, 2017, presented with complaint of chest pain and abdominal pain, was found to have small bowel obstruction.  Initially treated conservatively with improvement.  Right before the discharge patient had reoccurrence of nausea and vomiting with recurrence of SBO and surgery recommended comfort measures for the patient.  Patient not interested in any surgical intervention.  Palliative care was consulted and patient's care will transition to complete comfort Currently further plan is continue comfort measures.  Assessment and Plan: 1.  Left inguinal hernia with small bowel obstruction. General surgery consulted. Initially the recommended surgical intervention but patient refused any intervention. Patient improved with conservative measures and was able to tolerate diet and also had a bowel movement. On 11/13/2017 patient started with vomiting again and x-ray showed SBO again. Family and the patient decided to transition patient's care to complete comfort due to high-grade obstruction.  2.  Acute respiratory failure. Aspiration pneumonia. Now comfort measures.  3.  Acute kidney injury. From dehydration. Now comfort measures.  4.  Tachycardia. Tachypnea. Continue comfort measures for now.  Continue scheduled morphine. Patient had excessive Secretion for which patient was on IV Robinul, now lost IV access, will change it to oral atropine.  Diet: Comfort feed DVT Prophylaxis: Comfort care  Advance goals of care discussion: DNR/DNI, comfort care, anticipate in hospital death  Family Communication: no family was present at bedside, at the time of interview.     Disposition:  Discharge to be determined inpatient hospice versus in hospital death.  Consultants: General surgery  Procedures: none  Antibiotics: Anti-infectives (From admission, onward)   Start     Dose/Rate Route Frequency Ordered Stop   11/15/17 1330  piperacillin-tazobactam (ZOSYN) IVPB 2.25 g  Status:  Discontinued     2.25 g 100 mL/hr over 30 Minutes Intravenous Every 6 hours 11/15/17 1251 11/15/17 1606       Objective: Physical Exam: Vitals:   11/17/17 1958 11/17/17 2032 11/18/17 1334 11/18/17 1617  BP: (!) 159/61     Pulse: (!) 120 (!) 130    Resp: 16  12 16   Temp: 98.9 F (37.2 C)     TempSrc: Axillary     SpO2: (!) 85%     Weight:      Height:        Intake/Output Summary (Last 24 hours) at 11/19/2017 1546 Last data filed at 11/19/2017 8119 Gross per 24 hour  Intake 0 ml  Output 550 ml  Net -550 ml   Filed Weights   11/14/17 0505 11/15/17 0500 11/16/17 0440  Weight: 62.2 kg (137 lb 2 oz) 60.8 kg (134 lb) 62.1 kg (137 lb)   General: Drowsy and lethargic, not following any commands. ENT: Oral Mucosa clear moist Neck: difficult to assess  JVD, no Abnormal Mass Or lumps Cardiovascular: S1 and S2 Present, aortic systolic Murmur, Peripheral Pulses Present Respiratory: increased respiratory effort, Bilateral Air entry equal and Decreased, positive use of accessory muscle, basal Crackles, no wheezes Abdomen: Bowel Sound absent Skin: no redness, no Rash, no induration  Data Reviewed: CBC: Recent Labs  Lab 11/14/17 0529 11/15/17 0607  WBC 12.3* 10.1  HGB 13.4 11.9*  HCT 39.8 36.2*  MCV 93.2 94.3  PLT 236 235   Basic Metabolic Panel: Recent Labs  Lab 11/14/17 0529 11/15/17 0607  NA 139 142  K 4.0 4.1  CL 101 102  CO2 25 24  GLUCOSE 179* 178*  BUN 27* 58*  CREATININE 1.11 2.66*  CALCIUM 8.7* 8.5*  MG  --  2.1    Liver Function Tests: No results for input(s): AST, ALT, ALKPHOS, BILITOT, PROT, ALBUMIN in the last 168 hours. No results for  input(s): LIPASE, AMYLASE in the last 168 hours. No results for input(s): AMMONIA in the last 168 hours. Coagulation Profile: No results for input(s): INR, PROTIME in the last 168 hours. Cardiac Enzymes: No results for input(s): CKTOTAL, CKMB, CKMBINDEX, TROPONINI in the last 168 hours. BNP (last 3 results) No results for input(s): PROBNP in the last 8760 hours. CBG: Recent Labs  Lab 11/13/17 2011  GLUCAP 151*   Studies: No results found.  Scheduled Meds: . atropine  4 drop Sublingual QID  . morphine CONCENTRATE  10 mg Sublingual Q4H   Continuous Infusions: . sodium chloride 10 mL/hr at 11/15/17 1651   PRN Meds: acetaminophen **OR** acetaminophen, glycopyrrolate **OR** glycopyrrolate **OR** glycopyrrolate, haloperidol **OR** haloperidol **OR** haloperidol lactate, LORazepam **OR** LORazepam **OR** LORazepam, LORazepam, morphine injection, ondansetron (ZOFRAN) IV, prochlorperazine **OR** prochlorperazine **OR** prochlorperazine  Time spent: 35 minutes  Author: Lynden OxfordPranav Gracie Gupta, MD [facility] Pager: [phone] 11/19/2017 3:46 PM  If 7PM-7AM, please contact night-coverage at [email or web address], password [email or web address]

## 2017-11-20 NOTE — Progress Notes (Signed)
[facility] Progress Note  Patient: Benjamin Petersen ZOX:[id number]   PCP: Burton Apleyoberts, Ronald, MD DOB: [birth year]   DOA: 2018-06-06   DOS: 11/20/2017   Date of Service: the patient was seen and examined on 11/20/2017  Subjective: Mottling noted on legs as well as on hand and lips..  periods of apnea also noted.  Brief hospital course: Pt. with PMH of HTN, CAD, CABG; admitted on 2018-06-06, presented with complaint of chest pain and abdominal pain, was found to have small bowel obstruction.  Initially treated conservatively with improvement.  Right before the discharge patient had reoccurrence of nausea and vomiting with recurrence of SBO and surgery recommended comfort measures for the patient.  Patient not interested in any surgical intervention.  Palliative care was consulted and patient's care will transition to complete comfort Currently further plan is continue comfort measures.  Assessment and Plan: 1.  Left inguinal hernia with small bowel obstruction. General surgery consulted. Initially the recommended surgical intervention but patient refused any intervention. Patient improved with conservative measures and was able to tolerate diet and also had a bowel movement. On 11/13/2017 patient started with vomiting again and x-ray showed SBO again. Family and the patient decided to transition patient's care to complete comfort due to high-grade obstruction.  2.  Acute respiratory failure. Aspiration pneumonia. Now comfort measures.  3.  Acute kidney injury. From dehydration. Now comfort measures.  4.  Tachycardia. Tachypnea. Continue comfort measures for now.  Continue scheduled morphine. Patient had excessive Secretion for which patient was on IV Robinul, now lost IV access, will change it to oral atropine.  Diet: Comfort feed DVT Prophylaxis: Comfort care  Advance goals of care discussion: DNR/DNI, comfort care, anticipate in hospital death  Family Communication: no family was  present at bedside, at the time of interview.   Disposition:  Anticipating hospital death.  May be hours.  Consultants: General surgery  Procedures: none  Antibiotics: Anti-infectives (From admission, onward)   Start     Dose/Rate Route Frequency Ordered Stop   11/15/17 1330  piperacillin-tazobactam (ZOSYN) IVPB 2.25 g  Status:  Discontinued     2.25 g 100 mL/hr over 30 Minutes Intravenous Every 6 hours 11/15/17 1251 11/15/17 1606       Objective: Physical Exam: Vitals:   11/19/17 2151 11/20/17 0851 11/20/17 1000 11/20/17 1229  BP: (!) 125/48  (!) 122/48 135/65  Pulse: 100  98 93  Resp: 12 14 14 14   Temp: 98.7 F (37.1 C)     TempSrc: Axillary     SpO2: (!) 84%  (!) 84% 93%  Weight:      Height:        Intake/Output Summary (Last 24 hours) at 11/20/2017 1653 Last data filed at 11/20/2017 1647 Gross per 24 hour  Intake 0 ml  Output 650 ml  Net -650 ml   Filed Weights   11/14/17 0505 11/15/17 0500 11/16/17 0440  Weight: 62.2 kg (137 lb 2 oz) 60.8 kg (134 lb) 62.1 kg (137 lb)   General: Drowsy and lethargic, not following any commands. ENT: Oral Mucosa clear moist Neck: difficult to assess  JVD, no Abnormal Mass Or lumps Cardiovascular: S1 and S2 Present, aortic systolic Murmur, Peripheral Pulses Present Respiratory: increased respiratory effort, Bilateral Air entry equal and Decreased, positive use of accessory muscle, basal Crackles, no wheezes Abdomen: Bowel Sound absent Skin: no redness, no Rash, no induration  Data Reviewed: CBC: Recent Labs  Lab 11/14/17 0529 11/15/17 0607  WBC 12.3* 10.1  HGB 13.4  11.9*  HCT 39.8 36.2*  MCV 93.2 94.3  PLT 236 235   Basic Metabolic Panel: Recent Labs  Lab 11/14/17 0529 11/15/17 0607  NA 139 142  K 4.0 4.1  CL 101 102  CO2 25 24  GLUCOSE 179* 178*  BUN 27* 58*  CREATININE 1.11 2.66*  CALCIUM 8.7* 8.5*  MG  --  2.1    Liver Function Tests: No results for input(s): AST, ALT, ALKPHOS, BILITOT, PROT, ALBUMIN  in the last 168 hours. No results for input(s): LIPASE, AMYLASE in the last 168 hours. No results for input(s): AMMONIA in the last 168 hours. Coagulation Profile: No results for input(s): INR, PROTIME in the last 168 hours. Cardiac Enzymes: No results for input(s): CKTOTAL, CKMB, CKMBINDEX, TROPONINI in the last 168 hours. BNP (last 3 results) No results for input(s): PROBNP in the last 8760 hours. CBG: Recent Labs  Lab 11/13/17 2011  GLUCAP 151*   Studies: No results found.  Scheduled Meds: . atropine  4 drop Sublingual QID  . morphine CONCENTRATE  10 mg Sublingual Q4H   Continuous Infusions: . sodium chloride 10 mL/hr at 11/15/17 1651   PRN Meds: acetaminophen **OR** acetaminophen, glycopyrrolate **OR** glycopyrrolate **OR** glycopyrrolate, haloperidol **OR** haloperidol **OR** haloperidol lactate, LORazepam **OR** LORazepam **OR** LORazepam, LORazepam, morphine injection, ondansetron (ZOFRAN) IV, prochlorperazine **OR** prochlorperazine **OR** prochlorperazine  Time spent: 20 minutes  Author: Lynden Oxford, MD [facility] Pager: [phone] 11/20/2017 4:53 PM  If 7PM-7AM, please contact night-coverage at [email or web address], password [facility]

## 2017-11-20 DEATH — deceased

## 2017-11-21 NOTE — Progress Notes (Signed)
Nutrition Brief Note  Chart reviewed. Pt now transitioning to comfort care.  No further nutrition interventions warranted at this time.  Please re-consult as needed.   Lanika Colgate A. Edi Gorniak, RD, LDN, CDE Pager: [phone] After hours Pager: [phone]  

## 2017-11-21 NOTE — Progress Notes (Signed)
[facility] Progress Note  Patient: Benjamin Petersen ZOX:[id number]   PCP: Burton Apleyoberts, Ronald, MD DOB: Jan 15, 1924   DOA: 2017/11/07   DOS: 11/21/2017   Date of Service: the patient was seen and examined on 11/21/2017  Subjective: Comfortable, no significant change from yesterday in his condition.  Brief hospital course: Pt. with PMH of HTN, CAD, CABG; admitted on 2017/11/07, presented with complaint of chest pain and abdominal pain, was found to have small bowel obstruction.  Initially treated conservatively with improvement.  Right before the discharge patient had reoccurrence of nausea and vomiting with recurrence of SBO and surgery recommended comfort measures for the patient.  Patient not interested in any surgical intervention.  Palliative care was consulted and patient's care will transition to complete comfort Currently further plan is continue comfort measures.  Assessment and Plan: 1.  Left inguinal hernia with small bowel obstruction. General surgery consulted. Initially the recommended surgical intervention but patient refused any intervention. Patient improved with conservative measures and was able to tolerate diet and also had a bowel movement. On 11/13/2017 patient started with vomiting again and x-ray showed SBO again. Family and the patient decided to transition patient's care to complete comfort due to high-grade obstruction.  2.  Acute hypoxic respiratory failure. Aspiration pneumonia. Now comfort measures.  3.  Acute kidney injury. From dehydration. Now comfort measures.  4.  Tachycardia. Tachypnea. Continue comfort measures for now.  Continue scheduled morphine. Patient had excessive Secretion for which patient was on IV Robinul, now lost IV access, will change it to oral atropine.  Diet: Comfort feed DVT Prophylaxis: Comfort care  Advance goals of care discussion: DNR/DNI, comfort care, anticipate in hospital death, requesting palliative care to make comments on  further management.  Family Communication: no family was present at bedside, at the time of interview.   Disposition:  Anticipating hospital death.  Palliative care will see tomorrow and make recommendation.  Consultants: General surgery  Procedures: none  Antibiotics: Anti-infectives (From admission, onward)   Start     Dose/Rate Route Frequency Ordered Stop   11/15/17 1330  piperacillin-tazobactam (ZOSYN) IVPB 2.25 g  Status:  Discontinued     2.25 g 100 mL/hr over 30 Minutes Intravenous Every 6 hours 11/15/17 1251 11/15/17 1606       Objective: Physical Exam: Vitals:   11/20/17 1229 11/20/17 1820 11/21/17 0047 11/21/17 1333  BP: 135/65  (!) 119/41 (!) 115/40  Pulse: 93  94 90  Resp: 14 20 14 16   Temp:   98 F (36.7 C) 98.1 F (36.7 C)  TempSrc:   Oral Axillary  SpO2: 93%  (!) 83% (!) 83%  Weight:      Height:        Intake/Output Summary (Last 24 hours) at 11/21/2017 1721 Last data filed at 11/21/2017 1332 Gross per 24 hour  Intake 0 ml  Output 250 ml  Net -250 ml   Filed Weights   11/14/17 0505 11/15/17 0500 11/16/17 0440  Weight: 62.2 kg (137 lb 2 oz) 60.8 kg (134 lb) 62.1 kg (137 lb)   General: Drowsy and lethargic, not following any commands. ENT: Oral Mucosa clear moist Neck: difficult to assess  JVD, no Abnormal Mass Or lumps Cardiovascular: S1 and S2 Present, aortic systolic Murmur, Peripheral Pulses Present Respiratory: increased respiratory effort, Bilateral Air entry equal and Decreased, positive use of accessory muscle, basal Crackles, no wheezes Abdomen: Bowel Sound absent Skin: no redness, no Rash, no induration  Data Reviewed: CBC: Recent Labs  Lab 11/15/17  0607  WBC 10.1  HGB 11.9*  HCT 36.2*  MCV 94.3  PLT 235   Basic Metabolic Panel: Recent Labs  Lab 11/15/17 0607  NA 142  K 4.1  CL 102  CO2 24  GLUCOSE 178*  BUN 58*  CREATININE 2.66*  CALCIUM 8.5*  MG 2.1    Liver Function Tests: No results for input(s): AST, ALT,  ALKPHOS, BILITOT, PROT, ALBUMIN in the last 168 hours. No results for input(s): LIPASE, AMYLASE in the last 168 hours. No results for input(s): AMMONIA in the last 168 hours. Coagulation Profile: No results for input(s): INR, PROTIME in the last 168 hours. Cardiac Enzymes: No results for input(s): CKTOTAL, CKMB, CKMBINDEX, TROPONINI in the last 168 hours. BNP (last 3 results) No results for input(s): PROBNP in the last 8760 hours. CBG: No results for input(s): GLUCAP in the last 168 hours. Studies: No results found.  Scheduled Meds: . atropine  4 drop Sublingual QID  . morphine CONCENTRATE  10 mg Sublingual Q4H   Continuous Infusions: . sodium chloride 10 mL/hr at 11/15/17 1651   PRN Meds: acetaminophen **OR** acetaminophen, glycopyrrolate **OR** glycopyrrolate **OR** glycopyrrolate, haloperidol **OR** haloperidol **OR** haloperidol lactate, LORazepam **OR** LORazepam **OR** LORazepam, LORazepam, morphine injection, ondansetron (ZOFRAN) IV, prochlorperazine **OR** prochlorperazine **OR** prochlorperazine  Time spent: 20 minutes  Author: Lynden Oxford, MD [facility] Pager: [phone] 11/21/2017 5:21 PM  If 7PM-7AM, please contact night-coverage at [email or web address], password [facility]

## 2017-11-22 NOTE — Progress Notes (Signed)
Patient is on comfort measures. VS are stable. Creating urine. Family is at bedside. Will continue to monitor.

## 2017-11-22 NOTE — Progress Notes (Signed)
Patient ID: Benjamin Petersen, male   DOB: 1924-01-23, 84 y.o.   MRN: [id number]   This NP visited patient at the bedside as a follow for palliative medicine needs end emotional support.  No family at bedside.  Patient remains unresponsive, feet cool and mottled, pulse thready.  He appears comfortable as he transitions at EOL, prognosis is likely hours.  Receiving scheduled Roxanol.  Expect hospital death.  Placed call to family, unable to leave message.  I will try to cath them at bedside as they visit most days.   Discussed with nursing.  Total time spent on the unit was 15 minutes  Greater than 50% of the time was spent in counseling and coordination of care  Lorinda CreedMary Larach NP  Palliative Medicine Team Team Phone # [phone] Pager [phone]

## 2017-11-22 NOTE — Progress Notes (Signed)
[facility]  Noted patient is on comfort care for SBO, aspiration pneumonia & AKI.  On my exam, he is motionless and not even responsive to pain, fingernails are cyanotic but legs are warm.  Cont comfort measures.   Calvert CantorSaima Brendan Gadson, MD

## 2017-12-20 NOTE — Death Summary Note (Signed)
DEATH SUMMARY   Patient Details  Name: Benjamin Petersen MRN: [id number] DOB: 22-Feb-1924  Admission/Discharge Information   Admit Date:  11/01/2017  Date of Death: Date of Death: 07/13/18  Time of Death: Time of Death: 0616  Length of Stay: 10  Referring Physician: Burton Apleyoberts, Ronald, MD     Diagnoses  Preliminary cause of death:  Secondary Diagnoses (including complications and co-morbidities):  Principal Problem:   SBO (small bowel obstruction) (HCC) Active Problems:    Acute respiratory distress   Aspiration pneumonia due to gastric secretions (HCC)   Hypoxia   Essential hypertension   Leukocytosis   Left inguinal hernia   Goals of care, counseling/discussion   Palliative care by specialist   Vomiting   ARF (acute renal failure) [facility](HCC)      Brief Hospital Course (including significant findings, care, treatment, and services provided and events leading to death)  Benjamin Petersen is a 84 y.o. year old male who with PMH of HTN, CAD, CABG; admitted on 10/20/2017, presented with complaint of chest pain and abdominal pain, was found to have small bowel obstruction.  Initially treated conservatively with improvement.  Right before the discharge patient had reoccurrence of nausea and vomiting with recurrence of SBO and surgery recommended comfort measures for the patient.  Patient not interested in any surgical intervention.  Palliative care was consulted and patient's care was transitioned to comfort care.   He expired on 12/15/2017 at 6:16 AM      Pertinent Labs and Studies  Significant Diagnostic Studies Dg Chest 2 View  Result Date: 11/14/2017 CLINICAL DATA:  Vomiting. History of coronary artery disease and CABG. Nonsmoker EXAM: CHEST - 2 VIEW COMPARISON:  PA and lateral chest x-ray of November 10, 2017 FINDINGS: The lungs are adequately inflated. There is hazy increased density in the left lower lobe. There is blunting of the posterior costophrenic angles. The heart and pulmonary  vascularity are normal. There are post median sternotomy changes. There is calcification in the wall of the thoracic aorta. There is prominent thoracic kyphosis. IMPRESSION: New small bilateral pleural effusions layering posteriorly. Subtle increased lung markings at the left base worrisome for atelectasis or pneumonia. Followup PA and lateral chest X-ray is recommended in 3-4 weeks following trial of antibiotic therapy to ensure resolution and exclude underlying malignancy. Previous CABG.  Thoracic aortic atherosclerosis. Electronically Signed   By: David  SwazilandJordan M.D.   On: 11/14/2017 10:07   Dg Chest 2 View  Result Date: 11/09/2017 CLINICAL DATA:  Chest and epigastric pain. EXAM: CHEST - 2 VIEW COMPARISON:  None. FINDINGS: Post median sternotomy. Heart size normal. There is aortic atherosclerosis. Mild vascular congestion. Streaky bibasilar opacities. There is an azygos fissure. No definite pleural fluid or pneumothorax. IMPRESSION: 1. Streaky bibasilar opacities, likely atelectasis. 2. Post median sternotomy with aortic atherosclerosis. Mild vascular congestion. Electronically Signed   By: Rubye OaksMelanie  Ehinger M.D.   On: 11/17/2017 22:57   Dg Abd 1 View  Result Date: 11/15/2017 CLINICAL DATA:  Abdominal pain and distention EXAM: ABDOMEN - 1 VIEW COMPARISON:  November 14, 2017 FINDINGS: There is generalized bowel dilatation. No free air evident. Postoperative changes are noted in the pelvis. IMPRESSION: Suspect generalized ileus, although a degree of distal bowel obstruction is a differential consideration. No free air evident. Appearance is similar to 1 day prior. Electronically Signed   By: Bretta BangWilliam  Woodruff III M.D.   On: 11/15/2017 09:51   Dg Abd 1 View  Result Date: 11/14/2017 CLINICAL DATA:  Nasogastric tube placement. EXAM:  ABDOMEN - 1 VIEW COMPARISON:  Radiographs November 13, 2016. FINDINGS: Mild small bowel dilatation is noted concerning for ileus or obstruction. Stool is noted in the left colon.  Distal tip of nasogastric tube is not clearly visualized. IMPRESSION: Nasogastric tube not clearly visualized. Mild small bowel dilatation is noted concerning for ileus or obstruction. Electronically Signed   By: Lupita Raider, M.D.   On: 11/14/2017 21:10   Ct Abdomen Pelvis W Contrast  Result Date: 11/11/2017 CLINICAL DATA:  Initial evaluation for acute epigastric abdominal pain for 1 day. EXAM: CT ABDOMEN AND PELVIS WITH CONTRAST TECHNIQUE: Multidetector CT imaging of the abdomen and pelvis was performed using the standard protocol following bolus administration of intravenous contrast. CONTRAST:  ISOVUE-300 IOPAMIDOL (ISOVUE-300) INJECTION 61% COMPARISON:  None available. FINDINGS: Lower chest: Scattered bibasilar atelectatic and/or fibrotic changes present within the lung bases. Superimposed bronchiectasis present at the medial left lung base. No pleural or pericardial effusion. Prominent coronary artery calcifications noted. Hepatobiliary: Multiple scattered cysts noted within the liver, largest discrete of which position within the medial left hepatic lobe in measures approximately 2 cm. Gallbladder irregular invaginating upon the adjacent hepatic parenchyma without associated inflammatory changes. Mild intra and extrahepatic biliary dilatation, suspected to be related to advanced age. Pancreas: Pancreas demonstrates no acute abnormality. Scattered punctate calcifications at the uncinate process of the pancreas may reflect sequelae of chronic pancreatitis. No acute peripancreatic inflammation. No significant pancreatic ductal dilatation. Spleen: Spleen within normal limits. Adrenals/Urinary Tract: Diffuse thickening of the adrenal glands noted bilaterally without discrete mass. Kidneys equal in size with symmetric enhancement. 19 mm left renal cyst noted. No nephrolithiasis, hydronephrosis, or focal enhancing renal mass. No appreciable hydroureter. Partially distended bladder within normal limits.  Stomach/Bowel: Intraluminal fluid density noted within the distal esophagus. Small hiatal hernia. Stomach moderately distended with fluid in the gastric lumen. There are multiple dilated loops of bowel scattered throughout the abdomen with internal air-fluid levels, consistent with small bowel obstruction. These measure up to approximately 3 cm in diameter. There is a left inguinal hernia containing a short segment of fluid-filled small bowel (series 3, image 71). Small bowel is dilated proximally, and decompressed distally as it courses out of the hernia sac, consistent with small bowel obstruction. Distal small bowel is decompressed to the level of the terminal ileum. Moderate stool within the colon which is otherwise unremarkable without acute inflammatory changes. No findings to suggest acute appendicitis. Vascular/Lymphatic: Severe aorto bi-iliac atherosclerotic disease. No aneurysm. Mesenteric vessels are patent proximally. No adenopathy. Reproductive: Prostate somewhat enlarged measuring 5.1 cm in transverse diameter. Other: No free intraperitoneal air. Small volume free fluid within the abdomen and adjacent to the liver, likely reactive. Sequelae of probable prior inguinal hernia repair noted bilaterally, suggesting at the current left inguinal hernia is recurrent. Musculoskeletal: No acute osseus abnormality. Visualized osseous structures demonstrate a mottled appearance without discrete lytic or blastic osseous lesion. 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 IMPRESSION: 1. Left inguinal hernia containing the short-segment of small bowel with secondary small bowel obstruction. 2. Associated small volume free fluid within the abdomen, likely reactive. 3. Advanced 3 vessel coronary artery calcifications with severe atherosclerosis. 4. Additional incidental findings as above. Electronically Signed   By: Rise Mu M.D.   On: 11/11/2017 00:52   Dg Chest Port 1  View  Result Date: 11/15/2017 CLINICAL DATA:  Hypoxia EXAM: PORTABLE CHEST 1 VIEW COMPARISON:  November 14, 2017 FINDINGS: There are small pleural effusions bilaterally with bibasilar atelectasis. No  consolidation. There is an azygos lobe on the right, an anatomic variant. Heart is upper normal in size with pulmonary vascularity within normal limits. No adenopathy. There is aortic atherosclerosis. Areas of postoperative change with prior median sternotomy and coronary artery bypass grafting. IMPRESSION: Small pleural effusions and bibasilar atelectasis. Stable cardiac silhouette. Aortic atherosclerosis noted. Aortic Atherosclerosis (ICD10-I70.0). Electronically Signed   By: Bretta Bang III M.D.   On: 11/15/2017 09:52   Dg Abd 2 Views  Result Date: 11/14/2017 CLINICAL DATA:  Emesis EXAM: ABDOMEN - 2 VIEW COMPARISON:  CT 11/16/2017 FINDINGS: Atelectasis at the lung bases. Sternotomy changes. Multiple dilated loops of small bowel, measuring up to 3.6 cm consistent with a bowel obstruction. Postsurgical changes in the left pelvis/groin. Lucency in the left upper quadrant and across the midline presumably represents gas-filled stomach. : Multiple dilated loops of small bowel, consistent with continued mechanical bowel obstruction. Lucency in the left upper quadrant and across the upper abdomen, probably due to air distended stomach, however could attempt decubitus view or repeat CT if viscus perforation is suspected. Electronically Signed   By: Jasmine Pang M.D.   On: 11/14/2017 00:02   Dg Abd Portable 1v  Result Date: 11/15/2017 CLINICAL DATA:  84 y/o  M; NG tube placement. EXAM: PORTABLE ABDOMEN - 1 VIEW COMPARISON:  11/14/2017 abdomen radiograph FINDINGS: Enteric tube is coiling projecting over the lower chest, repositioning recommended. Stable partially visualized small bowel dilatation which may represent ileus or obstruction. IMPRESSION: Enteric tube is coiling over the lower chest, repositioning  recommended. These results will be called to the ordering clinician or representative by the Radiologist Assistant, and communication documented in the PACS or zVision Dashboard. Electronically Signed   By: Mitzi Hansen M.D.   On: 11/15/2017 00:30    Microbiology No results found for this or any previous visit (from the past 240 hour(s)).  Lab Basic Metabolic Panel: No results for input(s): NA, K, CL, CO2, GLUCOSE, BUN, CREATININE, CALCIUM, MG, PHOS in the last 168 hours. Liver Function Tests: No results for input(s): AST, ALT, ALKPHOS, BILITOT, PROT, ALBUMIN in the last 168 hours. No results for input(s): LIPASE, AMYLASE in the last 168 hours. No results for input(s): AMMONIA in the last 168 hours. CBC: No results for input(s): WBC, NEUTROABS, HGB, HCT, MCV, PLT in the last 168 hours. Cardiac Enzymes: No results for input(s): CKTOTAL, CKMB, CKMBINDEX, TROPONINI in the last 168 hours. Sepsis Labs: No results for input(s): PROCALCITON, WBC, LATICACIDVEN in the last 168 hours.      Nitasha Jewel 12/13/2017, 8:28 AM

## 2017-12-20 NOTE — Progress Notes (Signed)
Patient expired at [id number]. Death declared by Petra KubaErica Kingjames Coury RN and Jasmina Cvijetic,RN. Family notified. [facility] paged to sign death certificate. [place] [facility] being notified.

## 2017-12-20 DEATH — deceased

## 2018-07-30 IMAGING — DX DG ABDOMEN 1V
1 series · 1 of 1 positions shown · non-contrast
Comparison: Radiographs November 13, 2016.

CLINICAL DATA: Nasogastric tube placement.

EXAM:
ABDOMEN - 1 VIEW

[abdomen kub]
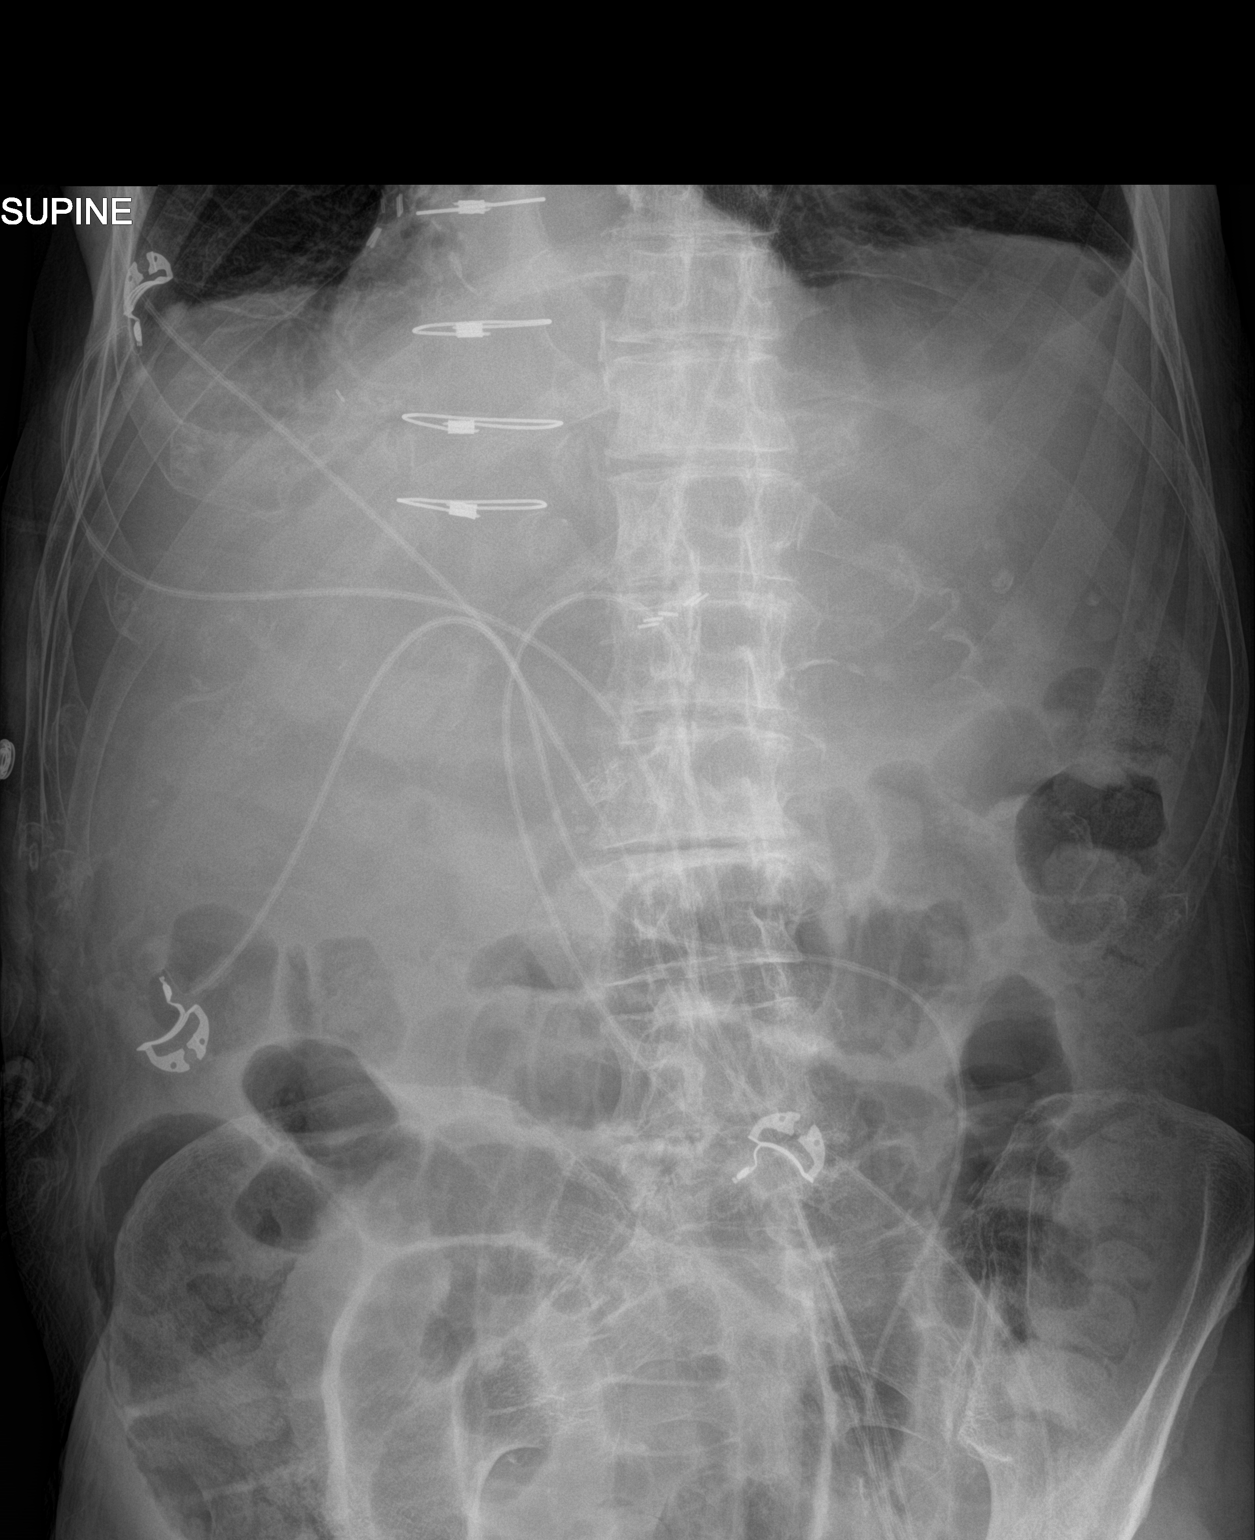

[1 of 1 positions shown; findings below may reference images not displayed]

FINDINGS: Mild small bowel dilatation is noted concerning for ileus or
obstruction. Stool is noted in the left colon. Distal tip of
nasogastric tube is not clearly visualized.
IMPRESSION: Nasogastric tube not clearly visualized. Mild small bowel dilatation
is noted concerning for ileus or obstruction.

## 2018-07-31 IMAGING — DX DG CHEST 1V PORT
1 series · 1 of 1 positions shown · non-contrast
Comparison: November 14, 2017

CLINICAL DATA: Hypoxia

EXAM:
PORTABLE CHEST 1 VIEW

[chest ap]
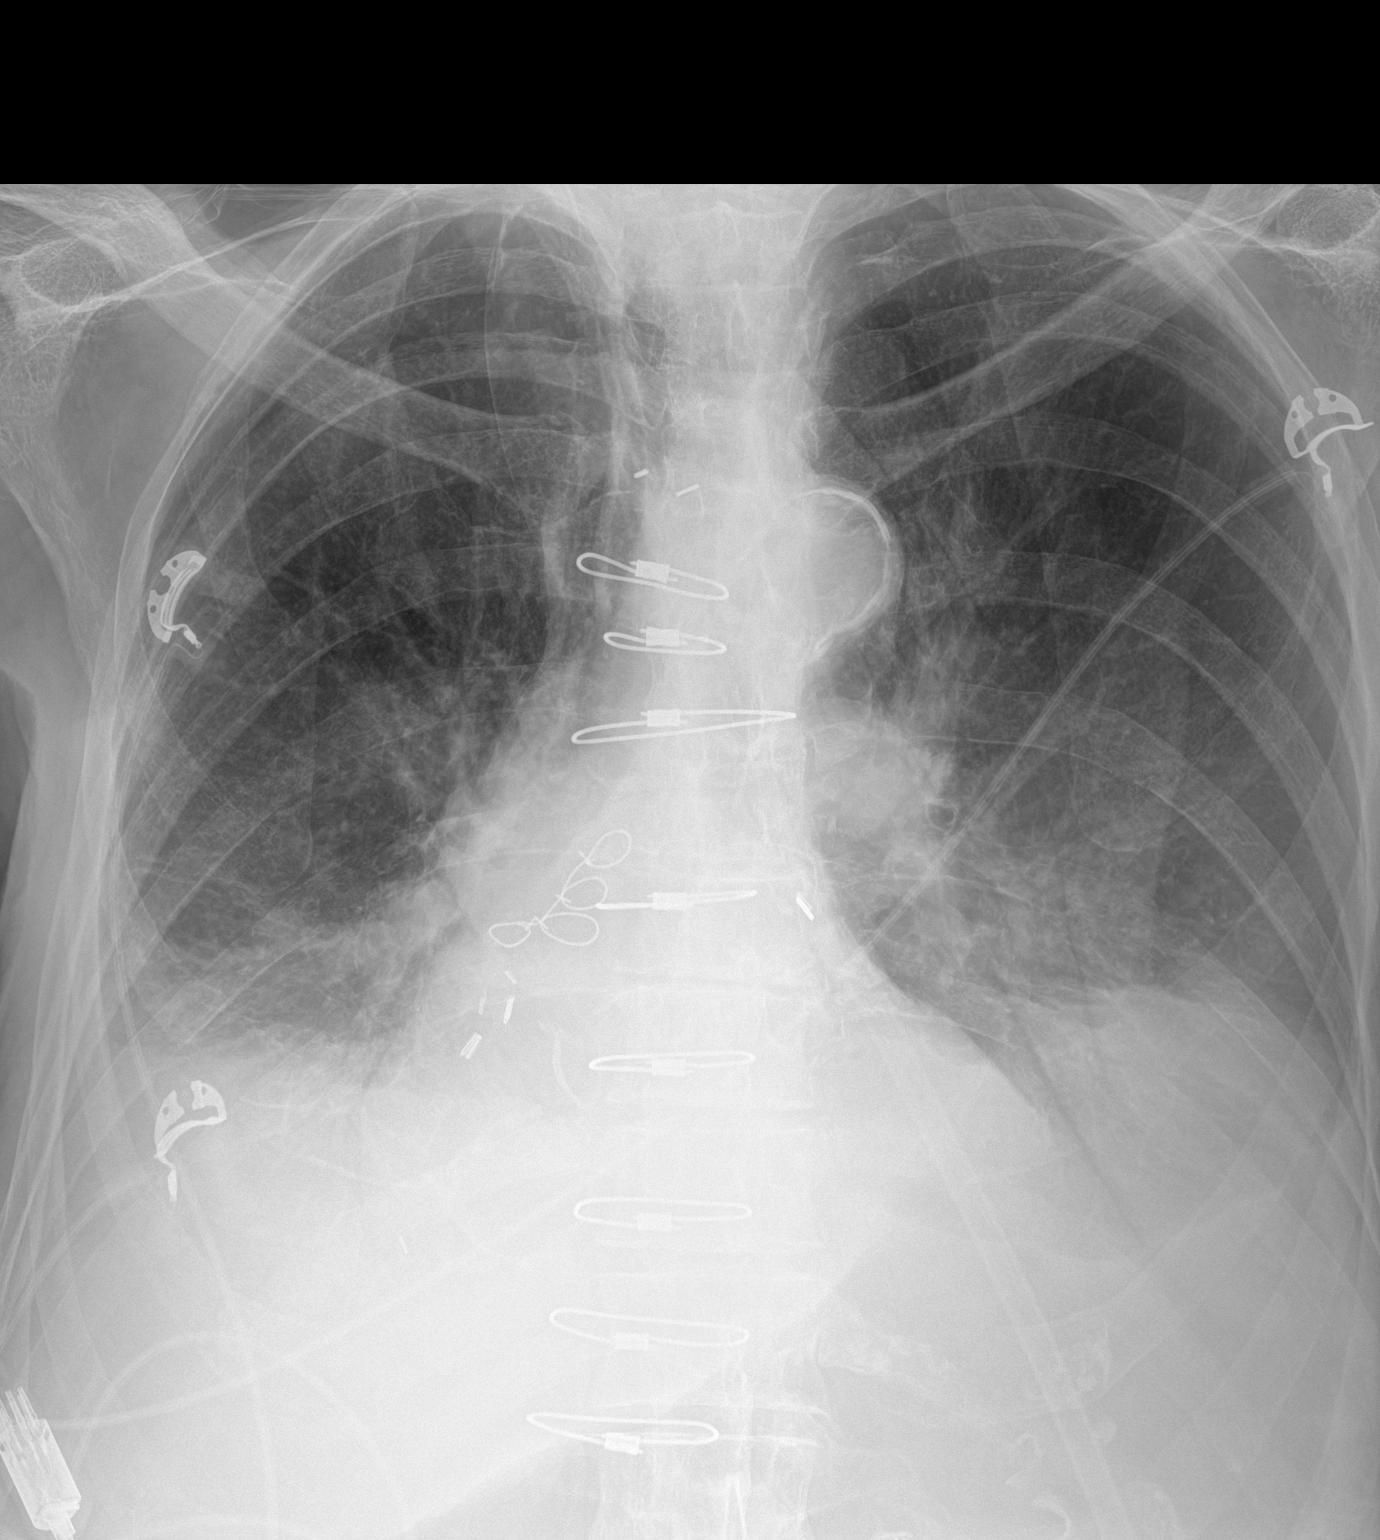

[1 of 1 positions shown; findings below may reference images not displayed]

FINDINGS: There are small pleural effusions bilaterally with bibasilar
atelectasis. No consolidation. There is an azygos lobe on the right,
an anatomic variant. Heart is upper normal in size with pulmonary
vascularity within normal limits. No adenopathy. There is aortic
atherosclerosis. Areas of postoperative change with prior median
sternotomy and coronary artery bypass grafting.
IMPRESSION: Small pleural effusions and bibasilar atelectasis. Stable cardiac
silhouette. Aortic atherosclerosis noted.

Aortic Atherosclerosis ([id number]).

## 2018-07-31 IMAGING — DX DG ABDOMEN 1V
1 series · 1 of 1 positions shown · non-contrast
Comparison: November 14, 2017

CLINICAL DATA: Abdominal pain and distention

EXAM:
ABDOMEN - 1 VIEW

[abdomen kub]
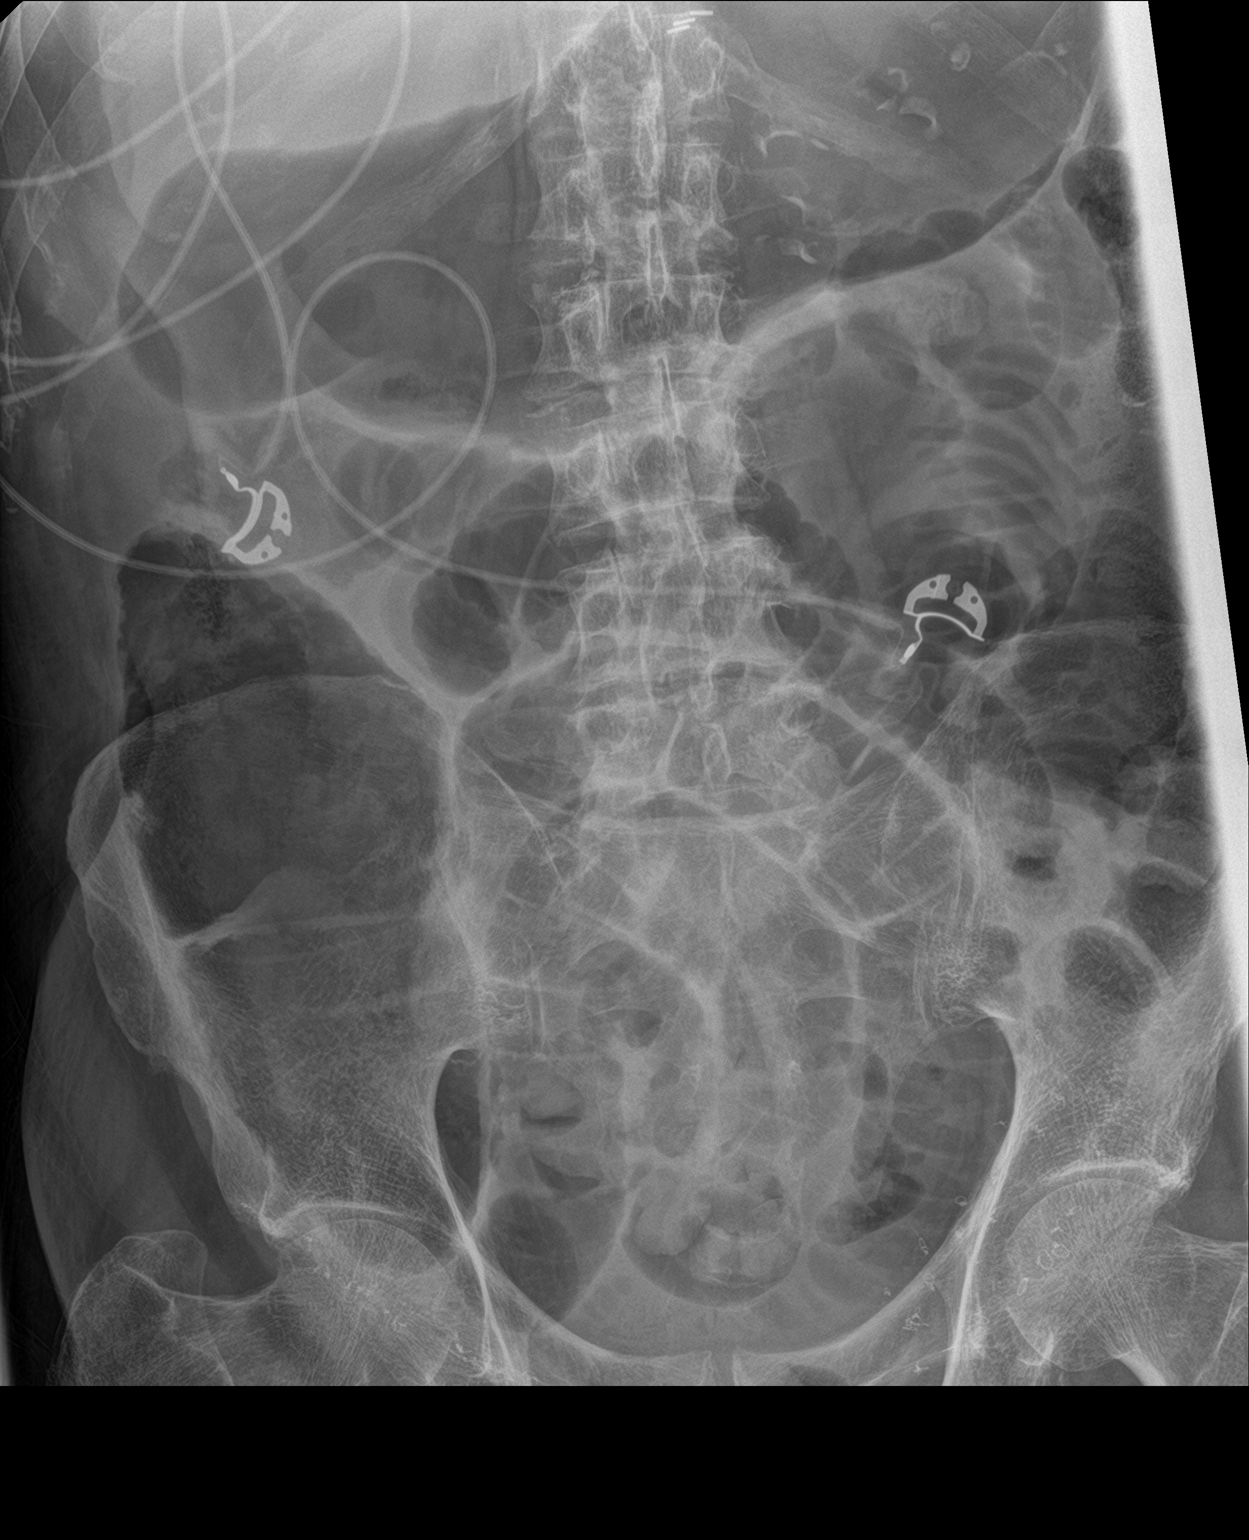

[1 of 1 positions shown; findings below may reference images not displayed]

FINDINGS: There is generalized bowel dilatation. No free air evident.
Postoperative changes are noted in the pelvis.
IMPRESSION: Suspect generalized ileus, although a degree of distal bowel
obstruction is a differential consideration. No free air evident.
Appearance is similar to 1 day prior.
